# Patient Record
Sex: Male | Born: 1990 | Race: White | Hispanic: No | Marital: Single | Smoking: Never smoker
Health system: Southern US, Community
[De-identification: ages and names within clinical notes are randomized; demographics above are authoritative.]

## PROBLEM LIST (undated history)

## (undated) DIAGNOSIS — Z9889 Other specified postprocedural states: Secondary | ICD-10-CM

## (undated) DIAGNOSIS — S80211A Abrasion, right knee, initial encounter: Secondary | ICD-10-CM

## (undated) DIAGNOSIS — Z8489 Family history of other specified conditions: Secondary | ICD-10-CM

## (undated) DIAGNOSIS — R112 Nausea with vomiting, unspecified: Secondary | ICD-10-CM

## (undated) DIAGNOSIS — S52121A Displaced fracture of head of right radius, initial encounter for closed fracture: Secondary | ICD-10-CM

## (undated) HISTORY — PX: WISDOM TOOTH EXTRACTION: SHX21

---

## 2012-12-04 ENCOUNTER — Emergency Department (HOSPITAL_COMMUNITY)
Admission: EM | Admit: 2012-12-04 | Discharge: 2012-12-04 | Disposition: A | Discharge status: Home / Self Care | Payer: Managed Care, Other (non HMO) | Attending: Emergency Medicine | Admitting: Emergency Medicine

## 2012-12-04 ENCOUNTER — Encounter (HOSPITAL_COMMUNITY): Payer: Self-pay | Admitting: *Deleted

## 2012-12-04 DIAGNOSIS — H9209 Otalgia, unspecified ear: Secondary | ICD-10-CM | POA: Insufficient documentation

## 2012-12-04 DIAGNOSIS — J029 Acute pharyngitis, unspecified: Secondary | ICD-10-CM | POA: Insufficient documentation

## 2012-12-04 LAB — RAPID STREP SCREEN (MED CTR MEBANE ONLY): Streptococcus, Group A Screen (Direct): NEGATIVE

## 2012-12-04 NOTE — ED Notes (Signed)
Throat pain x5 days 7/10. Reports right ear pain as well. No other complaints. Denies cough

## 2012-12-04 NOTE — ED Notes (Signed)
Pt alert and oriented x4. Respirations even and unlabored, bilateral symmetrical rise and fall of chest. Skin warm and dry. In no acute distress. Denies needs.   

## 2012-12-04 NOTE — ED Provider Notes (Signed)
History     CSN: [id number]  Arrival date & time 12/04/12  3086   First MD Initiated Contact with Patient 12/04/12 1002      Chief Complaint  Patient presents with  . Sore Throat  . possible strep throat   . Otalgia    (Consider location/radiation/quality/duration/timing/severity/associated sxs/prior treatment) Patient is a 22 y.o. male presenting with pharyngitis and ear pain. The history is provided by the patient.  Sore Throat This is a new problem. The current episode started in the past 7 days. The problem occurs constantly. The problem has been gradually worsening. Pertinent negatives include no abdominal pain, coughing, fever or headaches. Nothing aggravates the symptoms. He has tried nothing for the symptoms.  Otalgia Location:  Right Behind ear:  No abnormality Quality:  Aching Severity:  Mild Onset quality:  Gradual Timing:  Constant Progression:  Unchanged Chronicity:  New Relieved by:  None tried Worsened by:  Nothing tried Ineffective treatments:  None tried Associated symptoms: no abdominal pain, no cough, no fever and no headaches     History reviewed. No pertinent past medical history.  No past surgical history on file.  No family history on file.  History  Substance Use Topics  . Smoking status: Never Smoker   . Smokeless tobacco: Not on file  . Alcohol Use: Yes     Comment: 6 drinks/weekly    Review of Systems  Constitutional: Negative for fever.  HENT: Positive for ear pain.   Respiratory: Negative for cough.   Gastrointestinal: Negative for abdominal pain.  Neurological: Negative for headaches.  All other systems reviewed and are negative.    Allergies  Review of patient's allergies indicates no known allergies.  Home Medications  No current outpatient prescriptions on file.  BP 134/74  Pulse 97  Temp(Src) 97.7 F (36.5 C) (Oral)  Resp 16  SpO2 100%  Physical Exam  Constitutional: He is oriented to person, place, and time.  He appears well-developed and well-nourished. No distress.  HENT:  Head: Normocephalic and atraumatic.  Right Ear: Hearing normal.  Left Ear: Hearing, tympanic membrane and external ear normal.  No petechiae, or exudates of posterior pharynx. Slight injection of right TM  Eyes: Conjunctivae are normal. Pupils are equal, round, and reactive to light.  Neck: Normal range of motion. Neck supple.  Cardiovascular: Normal rate, regular rhythm and normal heart sounds.   Pulmonary/Chest: Effort normal and breath sounds normal.  Abdominal: Soft. There is no tenderness.  Musculoskeletal: Normal range of motion. He exhibits no edema and no tenderness.  Lymphadenopathy:    He has cervical adenopathy.  Neurological: He is alert and oriented to person, place, and time.    ED Course  Procedures (including critical care time)  Labs Reviewed  RAPID STREP SCREEN   No results found.  1. Viral pharyngitis     MDM  22 yo M with no PMH presenting with 5 day history of progressively worsening sore throat without any associated symptoms other than mild right ear pain.  No known sick contacts. Rapid strep screen still pending. Given history and physical exam, patient most likely has a viral URI. Expect that on day 5 of illness he will begin to improve soon. For now, continue symptomatic treatment. Patient discharged home in stable condition. Will call if strep test is positive. Discussed with Dr. Juleen China.   Hilarie Fredrickson, MD 12/04/12 1046

## 2012-12-04 NOTE — ED Notes (Signed)
MD at bedside. 

## 2012-12-04 NOTE — ED Notes (Addendum)
Pt escorted to discharge window. Pt verbalized understanding discharge instructions. In no acute distress.  

## 2012-12-06 NOTE — ED Provider Notes (Signed)
I saw and evaluated the patient, reviewed the resident's note and I agree with the findings and plan.  21yM with sore throat. Clinically suspect viral pharyngitis. No distress on exam. No evidence of deep space neck infection. Plan symptomatic tx.   Raeford Razor, MD 12/06/12 [phone]

## 2014-02-20 ENCOUNTER — Emergency Department (HOSPITAL_COMMUNITY)
Admission: EM | Admit: 2014-02-20 | Discharge: 2014-02-20 | Disposition: A | Discharge status: Home / Self Care | Payer: Managed Care, Other (non HMO) | Attending: Emergency Medicine | Admitting: Emergency Medicine

## 2014-02-20 ENCOUNTER — Emergency Department (HOSPITAL_COMMUNITY): Payer: Managed Care, Other (non HMO)

## 2014-02-20 ENCOUNTER — Encounter (HOSPITAL_COMMUNITY): Payer: Self-pay | Admitting: Emergency Medicine

## 2014-02-20 DIAGNOSIS — S52123A Displaced fracture of head of unspecified radius, initial encounter for closed fracture: Secondary | ICD-10-CM | POA: Insufficient documentation

## 2014-02-20 DIAGNOSIS — S52121A Displaced fracture of head of right radius, initial encounter for closed fracture: Secondary | ICD-10-CM

## 2014-02-20 DIAGNOSIS — S80211A Abrasion, right knee, initial encounter: Secondary | ICD-10-CM

## 2014-02-20 DIAGNOSIS — Y9351 Activity, roller skating (inline) and skateboarding: Secondary | ICD-10-CM | POA: Insufficient documentation

## 2014-02-20 DIAGNOSIS — S62009A Unspecified fracture of navicular [scaphoid] bone of unspecified wrist, initial encounter for closed fracture: Secondary | ICD-10-CM

## 2014-02-20 DIAGNOSIS — Y929 Unspecified place or not applicable: Secondary | ICD-10-CM | POA: Insufficient documentation

## 2014-02-20 HISTORY — DX: Displaced fracture of head of right radius, initial encounter for closed fracture: S52.121A

## 2014-02-20 HISTORY — DX: Abrasion, right knee, initial encounter: S80.211A

## 2014-02-20 MED ORDER — HYDROCODONE-ACETAMINOPHEN 5-325 MG PO TABS: 1.0000 | ORAL_TABLET | Freq: Four times a day (QID) | ORAL | Status: DC | PRN

## 2014-02-20 NOTE — ED Notes (Signed)
Pt was skateboarding, fell onto rt elbow.  C/o pain.  Happened 1 hr ago.  No deformity noted.

## 2014-02-20 NOTE — ED Provider Notes (Signed)
CSN: [id number]     Arrival date & time 02/20/14  1312 History  This chart was scribed for non-physician practitioner, Steven MuttonMarissa Quaneisha Hanisch, PA-C working with Steven Batonourtney F Horton, MD by Steven Snyder, ED scribe. This patient was seen in room WTR6/WTR6 and the patient's care was started at 2:47 PM.   Chief Complaint  Patient presents with  . Fall  . Arm Pain   The history is provided by the patient. No language interpreter was used.   HPI Comments: Steven Snyder is a 23 y.o. male who presents to the Emergency Department complaining of a fall that occurred about 2 hours ago. Pt was skateboarding with stuff in his hands, ran into his friends and fell onto his right arm. Denies hitting his head or LOC. He has sudden onset, aching right elbow and wrist pain. Movement worsens the pain. Denies shoulder pain, numbness or tingling. Pt has a past hairline fracture but is unsure if it was his right ulna or radius. Pt is ambidextrous but favors his right hand.   History reviewed. No pertinent past medical history. No past surgical history on file. No family history on file. History  Substance Use Topics  . Smoking status: Never Smoker   . Smokeless tobacco: Not on file  . Alcohol Use: Yes     Comment: 6 drinks/weekly    Review of Systems  Musculoskeletal: Positive for arthralgias.  Neurological: Negative for numbness.  All other systems reviewed and are negative.  Allergies  Review of patient's allergies indicates no known allergies.  Home Medications   Prior to Admission medications   Not on File   BP 116/76  Pulse 96  Temp(Src) 98.4 F (36.9 C) (Oral)  Resp 18  SpO2 100%  Physical Exam  Nursing note and vitals reviewed. Constitutional: He is oriented to person, place, and time. He appears well-developed and well-nourished. No distress.  HENT:  Head: Normocephalic and atraumatic.  Negative facial trauma Negative palpation hematomas  Eyes: Conjunctivae and EOM are normal. Pupils are  equal, round, and reactive to light. Right eye exhibits no discharge. Left eye exhibits no discharge.  Neck: Normal range of motion. Neck supple. No tracheal deviation present.  Negative neck stiffness Negative nuchal rigidity Negative cervical lymphadenopathy Negative pain upon palpation to the C-spine  Cardiovascular: Normal rate, regular rhythm and normal heart sounds.   Pulses:      Radial pulses are 2+ on the right side, and 2+ on the left side.  Capillary refill less than 3 seconds  Pulmonary/Chest: Effort normal and breath sounds normal. No respiratory distress. He has no wheezes. He has no rhonchi. He has no rales.  Patient is able to speak in full sentences without difficulty Negative use of accessory muscles Negative stridor  Musculoskeletal: He exhibits tenderness.       Right elbow: He exhibits decreased range of motion. He exhibits no swelling, no effusion and no deformity. Tenderness found. Radial head, medial epicondyle, lateral epicondyle and olecranon process tenderness noted.       Arms: Mild swelling identified to the right elbow, olecranon process. Abrasion identified to the medial epicondyles. Discomfort upon palpation to the medial condyle, lateral epicondyles and olecranon process of the right elbow. Discomfort upon palpation to the radial head region of the right arm. Discomfort with positive snuffbox tenderness to the right wrist. Discomfort upon the ulnar aspect of the right wrist. Decreased range of motion to the right elbow and right wrist secondary to pain. Decreased flexion extension to the right  elbow. Poor supination and pronation secondary to pain - patient unable to perform. Full range of motion to the digits of the right hand. Negative open wounds or signs of open fractures. Negative puncture wounds. Negative deformities.  Lymphadenopathy:    He has no cervical adenopathy.  Neurological: He is alert and oriented to person, place, and time. No cranial nerve  deficit. He exhibits normal muscle tone. Coordination normal.  Cranial nerves III-XII grossly intact Decreased grip strength to the right hand secondary to pain when compared to the left Strength intact to MCP, PIP, DIP joints of right hand Sensation intact with differentiation sharp and dull touch the right upper extremity Gait proper, proper balance - negative sway, negative drift, negative step-offs  Skin: Skin is warm and dry.  Superficial abrasion to the lateral epicondyles of the right elbow Superficial abrasion identified to the lateral aspect of the right knee bleeding controlled  Psychiatric: He has a normal mood and affect. His behavior is normal.    ED Course  Procedures (including critical care time)  DIAGNOSTIC STUDIES: Oxygen Saturation is 100% on RA, normal by my interpretation.    COORDINATION OF CARE: 2:54 PM-Discussed treatment plan which includes orthopedic consult with pt at bedside and pt agreed to plan.   Labs Review Labs Reviewed - No data to display  Imaging Review Dg Elbow Complete Right  02/20/2014   CLINICAL DATA:  Post fall, now with right posterior elbow pain. Unable to fully flex or extend the right elbow  EXAM: RIGHT ELBOW - COMPLETE 3+ VIEW  COMPARISON:  None.  FINDINGS: The lateral radiograph is degraded due to obliquity.  There is a minimally displaced fracture of the radial head with extension to the radial capitellar articulation. No definite joint effusion, though note, evaluation degraded due to obliquity. No additional fractures identified. Regional soft tissues appear normal. No radiopaque foreign body.  IMPRESSION: Minimally displaced fracture of the radial head.   Electronically Signed   By: Simonne ComeJohn  Snyder M.D.   On: 02/20/2014 14:08   Dg Forearm Right  02/20/2014   CLINICAL DATA:  Post fall, now with posterior elbow pain  EXAM: RIGHT FOREARM - 2 VIEW  COMPARISON:  DG ELBOW COMPLETE*R* dated 02/20/2014  FINDINGS: Re- demonstrated minimally displaced  radial head fracture with expected elbow joint effusion. No additional fractures identified. Regional soft tissues appear normal. No radiopaque foreign body.  IMPRESSION: 1. Minimally displaced radial head fracture with associated elbow joint effusion. Please refer to dedicated right elbow radiographs performed earlier same day. 2. No additional fractures identified.   Electronically Signed   By: Simonne ComeJohn  Snyder M.D.   On: 02/20/2014 14:20   Dg Wrist Complete Right  02/20/2014   CLINICAL DATA:  Post fall, now with posterior elbow pain  EXAM: RIGHT WRIST - COMPLETE 3+ VIEW  COMPARISON:  DG FOREARM*R* dated 02/20/2014  FINDINGS: The lateral radiograph is degraded due to obliquity.  There is a vertically aligned minimally displaced fracture involving the distal aspect of the scaphoid. The fracture extends to the scaphoid's articulation with the trapezium. No dislocation. No additional fractures identified. No evidence of chondrocalcinosis. No radiopaque foreign body.  IMPRESSION: Vertically aligned minimally displaced fracture involving the distal pole of the scaphoid with extension to the scaphoid's articulation with the trapezium. No radiopaque foreign body.   Electronically Signed   By: Simonne ComeJohn  Snyder M.D.   On: 02/20/2014 14:19   Ct Elbow Right W/o Cm  02/20/2014   CLINICAL DATA:  Right radial head fracture  following a fall.  EXAM: CT OF THE RIGHT ELBOW WITHOUT CONTRAST  TECHNIQUE: Multidetector CT imaging was performed according to the standard protocol. Multiplanar CT image reconstructions were also generated.  COMPARISON:  Radiographs obtained earlier today.  FINDINGS: Mildly comminuted fracture of the radial head with 3.5 mm of depression and impaction of the anterior fragment. There is an associated effusion. No additional fractures and no dislocation.  IMPRESSION: Mildly comminuted and depressed radial head fracture with an associated effusion.   Electronically Signed   By: Gordan Payment M.D.   On: 02/20/2014 16:10    Dg Humerus Right  02/20/2014   CLINICAL DATA:  Post fall, now with posterior elbow pain  EXAM: RIGHT HUMERUS - 2+ VIEW  COMPARISON:  DG ELBOW COMPLETE*R* dated 02/20/2014; DG FOREARM*R* dated 02/20/2014  FINDINGS: Re- demonstrated displaced radial head fracture. This finding is associated with an expected elbow joint effusion.  No additional fractures identified. Limited visualization of the adjacent shoulder is normal. Regional soft tissues appear normal. No radiopaque foreign body.  IMPRESSION: 1. Displaced fracture of the radial head with associated elbow joint effusion. Please refer to dedicated right elbow radiographs performed earlier same day. 2. No additional fractures identified.   Electronically Signed   By: Simonne Come M.D.   On: 02/20/2014 14:11     EKG Interpretation None      MDM   Final diagnoses:  Fracture of radial head, right, closed  Scaphoid fracture, wrist, closed    Filed Vitals:   02/20/14 1324  BP: 116/76  Pulse: 96  Temp: 98.4 F (36.9 C)  TempSrc: Oral  Resp: 18  SpO2: 100%   I personally performed the services described in this documentation, which was scribed in my presence. The recorded information has been reviewed and is accurate.  Plain films of right forearm, right wrist, right elbow identified minimally displaced fracture of the radial head with moderate joint effusion and vertically aligned minimally displaced fracture involving the distal pole of the scaphoid with extension to the scaphoid articulation with the trapezium. Negative findings of open fractures. This provider spoke with Dr. Merlyn Lot, hand surgeon who recommended CT of the right elbow. Recommended patient to be placed in a posterior long arm and a thumb spica splint with followup in his office on Monday. CT the right elbow without contrast identified mildly comminuted and depressed radial head fracture with an associated effusion. Patient presenting to the ED with a close radial head fracture  and scaphoid fracture - negative findings of open fracture. Patient neurovascularly intact. Sensation intact. Negative focal neurological deficits noted. Pulses palpable and strong, radial 2+. Strength intact to MCP, PIP, DIP joints of the right hand. This provider spoke in with hand surgeon who recommended patient be placed in a posterior long arm and thumb spica, with recommendation for patient to followup in his office as an outpatient. Patient stable, afebrile. Patient placed in splint, immobilizer given. Discharged patient. Discussed with patient to rest, ice, elevate. Discussed with patient importance of splint care and immobilizer. Referred patient to hand specialist discussed importance of following up. Discharge patient with small dose of pain medications-discussed course, precautions, disposal technique. Discussed with patient to closely monitor symptoms and if symptoms are to worsen or change to report back to the ED - strict return instructions given.  Patient agreed to plan of care, understood, all questions answered.   Steven Mutton, PA-C 02/20/14 2058

## 2014-02-20 NOTE — Discharge Instructions (Signed)
Please call Dr. Merrilee Seashore office on Monday first thing, he is expecting your phone call Please keep splint dry and arm immobilizer Please avoid any physical or strenuous activity Please continue monitor symptoms closely and if symptoms are to worsen or change (fever greater than 101, nausea, vomiting, chest pain, shortness of breath, difficulty breathing, swelling to the hands, changes to colors of the hands, fall, injury, numbness, tingling) is report back to the ED immediately   Elbow Fracture, Radial Head with Rehab The radial head is the end of the forearm bone on the thumb side of the arm (radius). It is part of the elbow. The radial head is vulnerable to both complete and incomplete fractures. SYMPTOMS   Severe elbow and arm pain, at the time of injury.  Tenderness, inflammation, and later bruising (contusion) of the elbow (within 48 hours).  Visible deformity, if the fracture is complete, and the bone fragments are not aligned properly (displaced).  Numbness, coldness, or paralysis in the elbow, forearm, or hand from pressure on the blood vessels or nerves (uncommon). CAUSES  An elbow fracture occurs when a force is placed on the bone that is greater than it can handle. Typical causes of injury include:  Indirect trauma, such as falling on an outstretched hand (most common cause).  Direct hit (trauma) to the elbow.  Twisting injury to the elbow. RISK INCREASES WITH:  Contact sports (football, rugby).  Sports in which falling is likely (skating, basketball).  Children under 84 years of age and adults over 79.  Bone or joint disease (osteoarthritis, bone tumor).  Poor strength and flexibility. PREVENTION   Warm up and stretch properly before activity.  Maintain physical fitness:  Strength, flexibility, and endurance.  Cardiovascular fitness.  When appropriate, wear properly fitted and padded elbow protection. PROGNOSIS  If treated properly, elbow fractures often  heal within 6 to 8 weeks for adults, and 4 to 6 weeks in children.  RELATED COMPLICATIONS   Fracture does not heal (nonunion).  Fracture heals in improper alignment (malunion).  Chronic pain, stiffness, loss of motion, or swelling of the elbow.  Excessive bleeding in the elbow or at the fracture site, causing pressure and injury to nerves and blood vessels (uncommon).  Calcification of the soft tissues around the elbow (heterotopic ossification).  Risk of bone death, due to interrupted blood supply caused by the fracture.  Unstable or arthritic joint, following repeated injury.  Stopping of normal bone growth in children.  Wasting away (atrophy), weakness, stiffness, numbness, and poor control of the hand, due to injury to blood vessels, nerves, cartilage, muscle, ligaments, and connective tissue sheets (fascia). TREATMENT  If the fracture is displaced, it must be put back in proper alignment (reduced) by an individual trained in the procedure. Often, displaced fractures cannot be realigned by hand, and surgery is needed. Once the bones are aligned (with or without surgery), ice and medicine can be used to reduce pain and inflammation. The elbow should be restrained for at least 1 to 2 weeks. After restraint, it is important to complete strengthening and stretching exercises, to regain strength and a full range of motion. Theses exercises may be completed at home or with a therapist.  MEDICATION   If pain medicine is needed, nonsteroidal anti-inflammatory medicines (aspirin and ibuprofen), or other minor pain relievers (acetaminophen), are often advised.  Do not take pain medicine for 7 days before surgery.  Prescription pain relievers may be given, if your caregiver thinks they are needed. Use only as  directed and only as much as you need. COLD THERAPY  Cold treatment (icing) should be applied for 10 to 15 minutes every 2 to 3 hours for inflammation and pain, and immediately after  activity that aggravates your symptoms. Use ice packs or an ice massage. SEEK MEDICAL CARE IF:  Pain, tenderness, or swelling gets worse, despite treatment.  You experience pain, numbness, or coldness in the hand.  Blue, gray, or dark color appears in the fingernails.  Any of the following occur after surgery: fever, increased pain, swelling, redness, drainage of fluids, or bleeding in the affected area.  New, unexplained symptoms develop. (Drugs used in treatment may produce side effects.) EXERCISES  RANGE OF MOTION (ROM) AND STRETCHING EXERCISES - Elbow Fracture (Radial Head) These exercises may help you restore your elbow mobility once your physician has discontinued your restraint period. Beginning these before your caregiver's approval may result in delayed healing. Your symptoms may go away with or without further involvement from your physician, physical therapist or athletic trainer. While completing these exercises, remember:   Restoring tissue flexibility helps normal motion to return to the joints. This allows healthier, less painful movement and activity.  An effective stretch should be held for at least 30 seconds. A stretch should never be painful. You should only feel a gentle lengthening or release in the stretch. RANGE OF MOTION  Supination, Active-Assisted  Sit with your right / left elbow bent at 90 degrees, resting your forearm on a table.  Keeping your upper body and shoulder in place, roll your forearm so your palm faces upward. When you can go no farther, use your opposite hand to help, until you feel a gentle to moderate stretch. Hold for __________ seconds.  Slowly release the stretch and return to the starting position. Repeat __________ times. Complete this exercise __________ times per day. RANGE OF MOTION  Pronation, Active-Assisted   Sit with your right / left elbow bent at 90 degrees, resting your forearm on a table.  Keeping your upper body and shoulder  in place, roll your forearm so your palm faces the tabletop. When you can go no farther, use your opposite hand to help, until you feel a gentle to moderate stretch. Hold for __________ seconds.  Slowly release the stretch and return to the starting position. Repeat __________ times. Complete this exercise __________ times per day. RANGE OF MOTION - Extension  Hold your right / left arm at your side and straighten your elbow as far as you can, using your right / left arm muscles.  Straighten the right / left elbow farther by gently pushing down on your forearm, until you feel a gentle stretch on the inside of your elbow. Hold this position for __________ seconds.  Slowly return to the starting position. Repeat __________ times. Complete this exercise __________ times per day.  RANGE OF MOTION  Flexion  Hold your right / left arm at your side and bend your elbow as far as you can, using your right / left arm muscles.  Bend the right / left elbow farther by gently pushing up on your forearm, until you feel a gentle stretch on the outside of your elbow. Hold this position for __________ seconds.  Slowly return to the starting position. Repeat __________ times. Complete this exercise __________ times per day.  RANGE OF MOTION  Supination, Active  Stand or sit with your elbows at your side. Bend your right / left elbow to 90 degrees.  Turn your palm upward  until you feel a gentle stretch on the inside of your forearm.  Hold this position for __________ seconds. Slowly release and return to the starting position. Repeat __________ times. Complete this stretch __________ times per day.  RANGE OF MOTION  Pronation, Active  Stand or sit with your elbows at your side. Bend your right / left elbow to 90 degrees.  Turn your palm downward until you feel a gentle stretch on the top of your forearm.  Hold this position for __________ seconds. Slowly release and return to the starting  position. Repeat __________ times. Complete this stretch __________ times per day.  RANGE OF MOTION  Elbow Flexion, Supine  Lie on your back. Extend your right / left arm into the air, bracing it with your opposite hand. Allow your right / left arm to relax.  Let your elbow bend, allowing your hand to fall slowly toward your chest.  You should feel a gentle stretch along the back of your upper arm and elbow. Your physician, physical therapist or athletic trainer may ask you to hold a __________ hand weight, to increase the intensity of this stretch.  Hold for __________ seconds. Slowly return your right / left arm to the upright position. Repeat __________ times. Complete this exercise __________ times per day. STRETCH  Elbow flexors   Lie on a firm bed or countertop, on your back. Be sure that you are in a comfortable position which will allow you to relax your arm muscles.  Place a folded towel under your right / left upper arm, so that your elbow and shoulder are at the same height. Extend your arm; your elbow should not rest on the bed or towel.  Allow the weight of your hand to straighten your elbow. Keep your arm and chest muscles relaxed. Your caregiver may ask you to increase the intensity of your stretch by adding a small wrist or hand weight.  Hold for __________ seconds. You should feel a stretch on the inside of your elbow. Slowly return to the starting position. Repeat __________ times. Complete this exercise __________ times per day. STRENGTHENING EXERCISES - Elbow Fracture (Radial Head) These exercises may help you restore your elbow mobility once your physician has discontinued your restraint period. Beginning these before your caregiver's approval may result in delayed healing. Your symptoms may go away with or without further involvement from your physician, physical therapist or athletic trainer. While completing these exercises, remember:   Restoring tissue flexibility  helps normal motion to return to the joints. This allows healthier, less painful movement and activity.  An effective stretch should be held for at least 30 seconds. A stretch should never be painful. You should only feel a gentle lengthening or release in the stretch.  You may experience muscle soreness or fatigue, but the pain or discomfort you are trying to eliminate should never worsen during these exercises. If this pain does get worse, stop and make sure you are following the directions exactly. If the pain is still present after adjustments, discontinue the exercise until you can discuss the trouble with your caregiver. STRENGTH - Elbow Flexors, Isometric  Stand or sit upright, on a firm surface. Place your right / left arm so that your hand is palm-up, and at the height of your waist.  Place your opposite hand on top of your forearm. Gently push down as your right / left arm resists. Push as hard as you can with both arms, without causing any pain or movement at  your right / left elbow. Hold this stationary position for __________ seconds.  Gradually release the tension in both arms. Allow your muscles to relax completely before repeating. Repeat __________ times. Complete this exercise __________ times per day. STRENGTH - Elbow Extensors, Isometric  Stand or sit upright, on a firm surface. Place your right / left arm so that your palm faces your stomach, and it is at the height of your waist.  Place your opposite hand on the underside of your forearm. Gently push up as your right / left arm resists. Push as hard as you can with both arms, without causing any pain or movement at your right / left elbow. Hold this stationary position for __________ seconds.  Gradually release the tension in both arms. Allow your muscles to relax completely before repeating. Repeat __________ times. Complete this exercise __________ times per day. STRENGTH  Elbow Flexors, Supinated  With good posture,  stand, or sit on a firm chair without armrests. Allow your right / left arm to rest at your side with your palm facing forward.  Holding a __________ weight, or gripping a rubber exercise band or tubing, bring your hand toward your shoulder.  Allow your muscles to control the resistance, as your hand returns to your side. Repeat __________ times. Complete this exercise __________ times per day.  STRENGTH - Elbow Extensors, Dynamic  With good posture, stand, or sit on a firm chair without armrests. Keeping your upper arms at your side, bring both hands up toward your right / left shoulder, while gripping a rubber exercise band or tubing. Your right / left hand should be just below the other hand.  Straighten your right / left elbow. Hold for __________ seconds.  Allow your muscles to control the rubber exercise band, as your hand returns to your shoulder. Repeat __________ times. Complete this exercise __________ times per day. STRENGTH  Forearm Supinators   Sit with your right / left forearm supported on a table, keeping your elbow below shoulder height. Rest your hand over the edge, palm down.  Gently grip a hammer or a soup ladle.  Without moving your elbow, slowly turn your palm and hand upward to a "thumbs-up" position.  Hold this position for __________ seconds. Slowly return to the starting position. Repeat __________ times. Complete this exercise __________ times per day.  STRENGTH  Forearm Pronators  Sit with your right / left forearm supported on a table, keeping your elbow below shoulder height. Rest your hand over the edge, palm up.  Gently grip a hammer or a soup ladle.  Without moving your elbow, slowly turn your palm and hand upward to a "thumbs-up" position.  Hold this position for __________ seconds. Slowly return to the starting position. Repeat __________ times. Complete this exercise __________ times per day.  Document Released: 10/08/2005 Document Revised:  12/31/2011 Document Reviewed: 01/20/2009 [facility] Patient Information 2014 [place], [place].   Emergency Department Resource Guide 1) Find a Doctor and Pay Out of Pocket Although you won't have to find out who is covered by your insurance plan, it is a good idea to ask around and get recommendations. You will then need to call the office and see if the doctor you have chosen will accept you as a new patient and what types of options they offer for patients who are self-pay. Some doctors offer discounts or will set up payment plans for their patients who do not have insurance, but you will need to ask so you aren't surprised  when you get to your appointment.  2) Contact Your Local Health Department Not all health departments have doctors that can see patients for sick visits, but many do, so it is worth a call to see if yours does. If you don't know where your local health department is, you can check in your phone book. The CDC also has a tool to help you locate your state's health department, and many state websites also have listings of all of their local health departments.  3) Find a Walk-in Clinic If your illness is not likely to be very severe or complicated, you may want to try a walk in clinic. These are popping up all over the country in pharmacies, drugstores, and shopping centers. They're usually staffed by nurse practitioners or physician assistants that have been trained to treat common illnesses and complaints. They're usually fairly quick and inexpensive. However, if you have serious medical issues or chronic medical problems, these are probably not your best option.  No Primary Care Doctor: - Call Health Connect at  [phone] - they can help you locate a primary care doctor that  accepts your insurance, provides certain services, etc. - Physician Referral Service- [phone]  Chronic Pain Problems: Organization         Address  Phone   Notes  [facility]   [phone] Patients need to be referred by their primary care doctor.   Medication Assistance: Organization         Address  Phone   Notes  [facility] Medication [facility] [address] [place], [place] [address] [phone] --Must be a resident of [facility] -- Must have NO insurance coverage whatsoever (no Medicaid/ Medicare, etc.) -- The pt. MUST have a primary care doctor that directs their care regularly and follows them in the community   [facility]  [phone]   [employer]  [phone]    Agencies that provide inexpensive medical care: Organization         Address  Phone   Notes  [facility]  [phone]   [facility]    [phone]   [facility] [address] [place], [place] [address] [phone]   [facility] [address], [place] [phone]   [facility]    [phone]   [facility]    [phone]   [facility]  [address], [place] Phone:  [phone], Fax:  [phone] Hours of Operation:  9 am - 6 pm, M-F.  Also accepts Medicaid/Medicare and self-pay.  [facility]  [address], [place] Phone: [phone], Fax: [phone]. Hours of Operation:  8:30 am - 5:30 pm, M-F.  Also accepts Medicaid and self-pay.  [facility] [place] [address] [place] Phone: [phone]   [facility] [address] Natasha Bence [place], [place] [phone], Ext. [phone] Mondays & Thursdays: 7-9 AM.  First 15 patients are seen on a first come, first serve basis.    Medicaid-accepting [facility] Providers:  Organization         Address  Phone   Notes  [facility] [address], [place] [phone] Also accepts self-pay patients.  [facility] [address] Laurell Josephs [place],  [place]  [phone]   [facility] [address] [place], [address], [place] [phone]   [facility] [address], [place] [phone]   Renaye Rakers [address], [place]   [phone] Only accepts [place] patients after they have their name applied to their card.   Self-Pay (no insurance) in [facility]:  Organization         Address  Phone   Notes  Sickle Cell Patients, [facility] [address] [place], [place] [phone]   [facility] [address] [place], [place] [phone]   [facility] [place]  [address], [place] [phone]   [facility]/Dr. Osei-Bonsu  [address] [place] or [address], [place] [phone] Phone number for both [place] and [place] locations is the same.  [facility] [address] [place] [phone]   [facility] [address] [place] or [address] [phone]   [facility] [address] [place] [phone], phone; [phone], fax Sees patients 1st and 3rd Saturday of every month.  Must not qualify for public or private insurance (i.e. Medicaid, Medicare, [place] Health Choice, Veterans' Benefits)  Household income should be no more than 200% of the poverty level The clinic cannot treat you if you are pregnant or think you are pregnant  Sexually transmitted diseases are not treated at the clinic.    Dental Care: Organization         Address  Phone  Notes  [facility] Department of [facility] [address] [place], [place] [phone] Accepts children up to age 17 who are enrolled in [place] or [place] Health Choice; pregnant women with a Medicaid card; and children who have applied for Medicaid or [place] [facility]  Choice, but were declined, whose parents can pay a reduced fee at time of service.  [facility] Department of [facility]  [address], [place] [phone] Accepts children up to age 64 who are enrolled in [place] or [place] Health Choice; pregnant women with a Medicaid card; and children who have applied for Medicaid or  Health Choice, but were declined, whose parents can pay a reduced fee at time of service.  [facility]  [address] [place], [place] [phone] Patients are seen by appointment only. Walk-ins are not accepted. [facility] will see patients 37 years of age and older. Monday - Tuesday (8am-5pm) Most Wednesdays (8:30-5pm) $30 per visit, cash only  [facility]  [address], [facility] [phone] Patients are seen by appointment only. Walk-ins are not accepted. [facility] will see patients 75 years of age and older. One Wednesday Evening (Monthly: Volunteer Based).  $30 per visit, cash only  [employer]  [phone] for adults; Children under age 14, call Graduate Pediatric Dentistry at [phone]. Children aged 8-14, please call [phone] to request a pediatric application.  Dental services are provided in all areas of dental care including fillings, crowns and bridges, complete and partial dentures, implants, gum treatment, root canals, and extractions. Preventive care is also provided. Treatment is provided to both adults and children. Patients are selected via a lottery and there is often a waiting list.   [phone]  Dental Clinic [address], [place]  [phone] [email or web address]   [facility] [address] [place], [place] [phone], Ext. [phone] Second and Fourth Thursday of each month, opens at 6:30 AM; Clinic ends at 9 AM.  Patients are seen on a first-come first-served basis, and a limited number are seen during each  clinic.   [facility]  [address] Ether Griffins [place], [place] [phone]   Eligibility Requirements You must have lived in [place], [place], or [place] counties for at least the last three months.   You cannot be eligible for state or federal sponsored [place], including [employer], [place], or [employer].   You generally cannot be eligible for healthcare insurance through your employer.    How to apply: Eligibility screenings are held every Tuesday and Wednesday afternoon from 1:00 pm until 4:00 pm. You do not need an appointment for the interview!  [facility] [address], [place], [place] [phone]   [facility] Health Department  [phone]   [facility] Health Department  [phone]   [facility] Health Department  [phone]    Behavioral Health Resources in the Community: Intensive Outpatient Programs Organization         Address  Phone  Notes  [facility] [address] [place], [place] [phone]   [facility] Outpatient [address], [place], [place] [phone]   [facility] [address] [place], [place]  [phone]   [facility] Mental Health [address] [place], [place] [phone] or [phone]   Substance Abuse Resources Organization         Address  Phone  Notes  [facility]  [phone]   [facility]  [phone]   [facility] [place]  [phone]   Floydene Flock  [phone]   Residential & Outpatient Substance Abuse Program  [phone]   Psychological Services Organization         Address  Phone  Notes  [facility]  [phone]   [facility]  [phone]   [facility] [address] [place] [phone] or [phone]    Mobile Crisis Teams Organization         Address  Phone  Notes  [facility], Mobile  Crisis Care Unit  [phone]   [facility]  [address] [place], [place] [phone]   Doristine Locks [address] [place] [phone]    Self-Help/Support Groups Organization         Address  Phone             Notes  [facility] - variety of support groups  336- [phone] Call for more information  Narcotics Anonymous (NA), [facility] [address], [employer] [place]  2 meetings at this location   Statistician         Address  Phone  Notes  [facility] [address],    [place]  [phone]   [facility]  [address], [place] [address], [place], [place] [phone]   [facility] [address] [place], [place] [phone] Admissions: 8am-3pm M-F  [facility] [address]    [place], [place] [phone]   [facility] [address] [place], [place], [place] [phone]   [facility] [address]  [place], [place] [phone]   [facility] Intensive Outpatient [address], [place], [place] [phone]   [facility] [address] [place]  [place], [place] [phone] or [phone]   [facility] [address], [place], [place] [phone] Accepts Medicaid  [facility] [place] [facility] [address]  [place] [phone] Substance Abuse/Addiction Treatment   [facility]         Address  Phone  Notes  [facility]  [phone]   Angie Fava, PhD [address]vin Knack [place], [place]   [phone] or [phone]   [facility]   [address] [place], [place] [phone]   [facility] [address], [place], [place] [phone] Insurance/Medicaid/sponsorship through [facility] [address]                                    [place], [place] [phone] Therapy/tele-psych/case  [facility] [address], [place] [phone]    Dr. Lolly Mustache  [phone]   [facility]  [employer] [facility] Dept. 1) [address], [place] 2) [address] [place] 3)  [address], [place] [phone]  [phone]   [facility] Child Abuse Hotline [phone] or [phone] (After Hours)

## 2014-02-21 NOTE — ED Provider Notes (Signed)
Medical screening examination/treatment/procedure(s) were performed by non-physician practitioner and as supervising physician I was immediately available for consultation/collaboration.   EKG Interpretation None       Shon Batonourtney F Navada Osterhout, MD 02/21/14 [phone]

## 2014-02-22 ENCOUNTER — Encounter (HOSPITAL_BASED_OUTPATIENT_CLINIC_OR_DEPARTMENT_OTHER): Payer: Self-pay | Admitting: *Deleted

## 2014-02-22 ENCOUNTER — Other Ambulatory Visit: Payer: Self-pay | Admitting: Orthopedic Surgery

## 2014-02-23 ENCOUNTER — Encounter (HOSPITAL_BASED_OUTPATIENT_CLINIC_OR_DEPARTMENT_OTHER): Payer: Managed Care, Other (non HMO) | Admitting: Certified Registered"

## 2014-02-23 ENCOUNTER — Encounter (HOSPITAL_BASED_OUTPATIENT_CLINIC_OR_DEPARTMENT_OTHER): Payer: Self-pay | Admitting: Certified Registered"

## 2014-02-23 ENCOUNTER — Ambulatory Visit (HOSPITAL_BASED_OUTPATIENT_CLINIC_OR_DEPARTMENT_OTHER)
Admission: RE | Admit: 2014-02-23 | Discharge: 2014-02-23 | Disposition: A | Discharge status: Home / Self Care | Payer: Managed Care, Other (non HMO) | Source: Ambulatory Visit | Attending: Orthopedic Surgery | Admitting: Orthopedic Surgery

## 2014-02-23 ENCOUNTER — Ambulatory Visit (HOSPITAL_BASED_OUTPATIENT_CLINIC_OR_DEPARTMENT_OTHER): Payer: Managed Care, Other (non HMO) | Admitting: Certified Registered"

## 2014-02-23 ENCOUNTER — Encounter (HOSPITAL_BASED_OUTPATIENT_CLINIC_OR_DEPARTMENT_OTHER)
Admission: RE | Disposition: A | Discharge status: Home / Self Care | Payer: Self-pay | Source: Ambulatory Visit | Attending: Orthopedic Surgery

## 2014-02-23 DIAGNOSIS — Y9239 Other specified sports and athletic area as the place of occurrence of the external cause: Secondary | ICD-10-CM | POA: Insufficient documentation

## 2014-02-23 DIAGNOSIS — Y92838 Other recreation area as the place of occurrence of the external cause: Secondary | ICD-10-CM

## 2014-02-23 DIAGNOSIS — S52123A Displaced fracture of head of unspecified radius, initial encounter for closed fracture: Secondary | ICD-10-CM | POA: Insufficient documentation

## 2014-02-23 HISTORY — DX: Displaced fracture of head of right radius, initial encounter for closed fracture: S52.121A

## 2014-02-23 HISTORY — PX: ORIF RADIAL FRACTURE: SHX5113

## 2014-02-23 HISTORY — DX: Family history of other specified conditions: Z84.89

## 2014-02-23 HISTORY — DX: Abrasion, right knee, initial encounter: S80.211A

## 2014-02-23 HISTORY — DX: Other specified postprocedural states: Z98.890

## 2014-02-23 HISTORY — DX: Other specified postprocedural states: R11.2

## 2014-02-23 SURGERY — OPEN REDUCTION INTERNAL FIXATION (ORIF) RADIAL FRACTURE
Anesthesia: Monitor Anesthesia Care | Site: Elbow | Laterality: Right

## 2014-02-23 MED ORDER — PROPOFOL 10 MG/ML IV BOLUS
INTRAVENOUS | Status: DC | PRN
  Administered 2014-02-23: 300 mg via INTRAVENOUS

## 2014-02-23 MED ORDER — MIDAZOLAM HCL 2 MG/2ML IJ SOLN
INTRAMUSCULAR | Status: AC
  Filled 2014-02-23: qty 2

## 2014-02-23 MED ORDER — ROPIVACAINE HCL 5 MG/ML IJ SOLN
INTRAMUSCULAR | Status: DC | PRN
  Administered 2014-02-23: 25 mL via PERINEURAL

## 2014-02-23 MED ORDER — ACETAMINOPHEN 325 MG PO TABS: 325.0000 mg | ORAL_TABLET | ORAL | Status: DC | PRN

## 2014-02-23 MED ORDER — OXYCODONE-ACETAMINOPHEN 5-325 MG PO TABS: ORAL_TABLET | ORAL | Status: AC

## 2014-02-23 MED ORDER — DEXAMETHASONE SODIUM PHOSPHATE 10 MG/ML IJ SOLN
INTRAMUSCULAR | Status: DC | PRN
  Administered 2014-02-23: 10 mg via INTRAVENOUS

## 2014-02-23 MED ORDER — FENTANYL CITRATE 0.05 MG/ML IJ SOLN
INTRAMUSCULAR | Status: AC
  Filled 2014-02-23: qty 6

## 2014-02-23 MED ORDER — KETOROLAC TROMETHAMINE 30 MG/ML IJ SOLN: 15.0000 mg | Freq: Once | INTRAMUSCULAR | Status: DC | PRN

## 2014-02-23 MED ORDER — MIDAZOLAM HCL 2 MG/ML PO SYRP: 12.0000 mg | ORAL_SOLUTION | Freq: Once | ORAL | Status: DC | PRN

## 2014-02-23 MED ORDER — CHLORHEXIDINE GLUCONATE 4 % EX LIQD: 60.0000 mL | Freq: Once | CUTANEOUS | Status: DC

## 2014-02-23 MED ORDER — OXYCODONE HCL 5 MG PO TABS: 5.0000 mg | ORAL_TABLET | Freq: Once | ORAL | Status: DC | PRN

## 2014-02-23 MED ORDER — FENTANYL CITRATE 0.05 MG/ML IJ SOLN
INTRAMUSCULAR | Status: DC | PRN
  Administered 2014-02-23 (×2): 25 ug via INTRAVENOUS

## 2014-02-23 MED ORDER — OXYCODONE HCL 5 MG/5ML PO SOLN: 5.0000 mg | Freq: Once | ORAL | Status: DC | PRN

## 2014-02-23 MED ORDER — HYDROMORPHONE HCL PF 1 MG/ML IJ SOLN: 0.2500 mg | INTRAMUSCULAR | Status: DC | PRN

## 2014-02-23 MED ORDER — ACETAMINOPHEN 160 MG/5ML PO SOLN: 325.0000 mg | ORAL | Status: DC | PRN

## 2014-02-23 MED ORDER — LIDOCAINE HCL (CARDIAC) 20 MG/ML IV SOLN
INTRAVENOUS | Status: DC | PRN
  Administered 2014-02-23: 50 mg via INTRAVENOUS

## 2014-02-23 MED ORDER — CEFAZOLIN SODIUM-DEXTROSE 2-3 GM-% IV SOLR
INTRAVENOUS | Status: AC
  Filled 2014-02-23: qty 50

## 2014-02-23 MED ORDER — LACTATED RINGERS IV SOLN
INTRAVENOUS | Status: DC
Start: 2014-02-23 — End: 2014-02-23
  Administered 2014-02-23: 14:00:00 via INTRAVENOUS

## 2014-02-23 MED ORDER — FENTANYL CITRATE 0.05 MG/ML IJ SOLN
INTRAMUSCULAR | Status: AC
  Filled 2014-02-23: qty 2

## 2014-02-23 MED ORDER — MIDAZOLAM HCL 2 MG/2ML IJ SOLN
1.0000 mg | INTRAMUSCULAR | Status: DC | PRN
  Administered 2014-02-23: 2 mg via INTRAVENOUS

## 2014-02-23 MED ORDER — FENTANYL CITRATE 0.05 MG/ML IJ SOLN
50.0000 ug | INTRAMUSCULAR | Status: DC | PRN
  Administered 2014-02-23: 100 ug via INTRAVENOUS

## 2014-02-23 MED ORDER — CEFAZOLIN SODIUM-DEXTROSE 2-3 GM-% IV SOLR
2.0000 g | INTRAVENOUS | Status: AC
  Administered 2014-02-23: 2 g via INTRAVENOUS

## 2014-02-23 MED ORDER — ONDANSETRON HCL 4 MG/2ML IJ SOLN
INTRAMUSCULAR | Status: DC | PRN
  Administered 2014-02-23: 4 mg via INTRAVENOUS

## 2014-02-23 MED ORDER — ONDANSETRON HCL 4 MG/2ML IJ SOLN: 4.0000 mg | Freq: Once | INTRAMUSCULAR | Status: DC | PRN

## 2014-02-23 SURGICAL SUPPLY — 61 items
BANDAGE ELASTIC 3 VELCRO ST LF (GAUZE/BANDAGES/DRESSINGS) ×6 IMPLANT
BIT DRILL MINI LNG ACUTRAK 2 (BIT) ×1 IMPLANT
BLADE 15 SAFETY STRL DISP (BLADE) IMPLANT
BLADE MINI RND TIP GREEN BEAV (BLADE) IMPLANT
BLADE SURG 15 STRL LF DISP TIS (BLADE) ×2 IMPLANT
BLADE SURG 15 STRL SS (BLADE) ×4
BNDG ELASTIC 2 VLCR STRL LF (GAUZE/BANDAGES/DRESSINGS) IMPLANT
BNDG ESMARK 4X9 LF (GAUZE/BANDAGES/DRESSINGS) ×3 IMPLANT
BNDG GAUZE ELAST 4 BULKY (GAUZE/BANDAGES/DRESSINGS) ×3 IMPLANT
BNDG PLASTER X FAST 3X3 WHT LF (CAST SUPPLIES) ×9 IMPLANT
CHLORAPREP W/TINT 26ML (MISCELLANEOUS) ×3 IMPLANT
CORDS BIPOLAR (ELECTRODE) ×3 IMPLANT
COVER MAYO STAND STRL (DRAPES) ×3 IMPLANT
COVER TABLE BACK 60X90 (DRAPES) ×3 IMPLANT
CUFF TOURNIQUET SINGLE 18IN (TOURNIQUET CUFF) ×3 IMPLANT
DRAPE EXTREMITY T 121X128X90 (DRAPE) ×3 IMPLANT
DRAPE OEC MINIVIEW 54X84 (DRAPES) ×3 IMPLANT
DRAPE SURG 17X23 STRL (DRAPES) ×3 IMPLANT
DRILL MINI LNG ACUTRAK 2 (BIT) ×3
GAUZE SPONGE 4X4 12PLY STRL (GAUZE/BANDAGES/DRESSINGS) ×3 IMPLANT
GAUZE XEROFORM 1X8 LF (GAUZE/BANDAGES/DRESSINGS) ×3 IMPLANT
GLOVE BIO SURGEON STRL SZ7.5 (GLOVE) ×3 IMPLANT
GLOVE BIOGEL PI IND STRL 8 (GLOVE) ×1 IMPLANT
GLOVE BIOGEL PI IND STRL 8.5 (GLOVE) ×1 IMPLANT
GLOVE BIOGEL PI INDICATOR 8 (GLOVE) ×2
GLOVE BIOGEL PI INDICATOR 8.5 (GLOVE) ×2
GLOVE SURG ORTHO 8.0 STRL STRW (GLOVE) ×3 IMPLANT
GOWN STRL REUS W/ TWL LRG LVL3 (GOWN DISPOSABLE) ×1 IMPLANT
GOWN STRL REUS W/TWL LRG LVL3 (GOWN DISPOSABLE) ×2
GOWN STRL REUS W/TWL XL LVL3 (GOWN DISPOSABLE) ×6 IMPLANT
GUIDEWIRE ORTHO MINI ACTK .045 (WIRE) ×9 IMPLANT
NEEDLE HYPO 22GX1.5 SAFETY (NEEDLE) IMPLANT
NEEDLE HYPO 25X1 1.5 SAFETY (NEEDLE) IMPLANT
NS IRRIG 1000ML POUR BTL (IV SOLUTION) ×3 IMPLANT
PACK BASIN DAY SURGERY FS (CUSTOM PROCEDURE TRAY) ×3 IMPLANT
PAD CAST 3X4 CTTN HI CHSV (CAST SUPPLIES) ×2 IMPLANT
PAD CAST 4YDX4 CTTN HI CHSV (CAST SUPPLIES) IMPLANT
PADDING CAST ABS 4INX4YD NS (CAST SUPPLIES) ×2
PADDING CAST ABS COTTON 4X4 ST (CAST SUPPLIES) ×1 IMPLANT
PADDING CAST COTTON 3X4 STRL (CAST SUPPLIES) ×4
PADDING CAST COTTON 4X4 STRL (CAST SUPPLIES)
SCREW ACUTRAK 2 MINI 16MM (Screw) ×3 IMPLANT
SCREW ACUTRAK 2 MINI 18MM (Screw) ×3 IMPLANT
SLEEVE SCD COMPRESS KNEE MED (MISCELLANEOUS) ×3 IMPLANT
SPLINT PLASTER CAST XFAST 4X15 (CAST SUPPLIES) IMPLANT
SPLINT PLASTER XTRA FAST SET 4 (CAST SUPPLIES)
STOCKINETTE 4X48 STRL (DRAPES) ×3 IMPLANT
SUCTION FRAZIER TIP 10 FR DISP (SUCTIONS) IMPLANT
SUT ETHILON 3 0 PS 1 (SUTURE) IMPLANT
SUT ETHILON 4 0 PS 2 18 (SUTURE) ×3 IMPLANT
SUT VIC AB 2-0 SH 27 (SUTURE) ×2
SUT VIC AB 2-0 SH 27XBRD (SUTURE) ×1 IMPLANT
SUT VIC AB 3-0 PS1 18 (SUTURE)
SUT VIC AB 3-0 PS1 18XBRD (SUTURE) IMPLANT
SUT VICRYL 4-0 PS2 18IN ABS (SUTURE) ×3 IMPLANT
SYR BULB 3OZ (MISCELLANEOUS) ×3 IMPLANT
SYR CONTROL 10ML LL (SYRINGE) IMPLANT
TOWEL OR 17X24 6PK STRL BLUE (TOWEL DISPOSABLE) ×6 IMPLANT
TUBE CONNECTING 20'X1/4 (TUBING)
TUBE CONNECTING 20X1/4 (TUBING) IMPLANT
UNDERPAD 30X30 INCONTINENT (UNDERPADS AND DIAPERS) ×3 IMPLANT

## 2014-02-23 NOTE — Anesthesia Procedure Notes (Addendum)
Anesthesia Regional Block:  Supraclavicular block  Pre-Anesthetic Checklist: ,, timeout performed, Correct Patient, Correct Site, Correct Laterality, Correct Procedure, Correct Position, site marked, Risks and benefits discussed,  Surgical consent,  Pre-op evaluation,  At surgeon's request and post-op pain management  Laterality: Upper and Right  Prep: chloraprep       Needles:  Injection technique: Single-shot  Needle Type: Echogenic Needle          Additional Needles:  Procedures: ultrasound guided (picture in chart) Supraclavicular block Narrative:  Start time: 02/23/2014 1:52 PM End time: 02/23/2014 1:58 PM Injection made incrementally with aspirations every 5 mL.  Performed by: Personally  Anesthesiologist: Moser  Additional Notes: H+P and labs reviewed, risks and benefits discussed with patient, procedure tolerated well without complications   Procedure Name: LMA Insertion Performed by: Lance CoonWEBSTER, Tovey Pre-anesthesia Checklist: Patient identified, Emergency Drugs available, Suction available and Patient being monitored Patient Re-evaluated:Patient Re-evaluated prior to inductionOxygen Delivery Method: Circle System Utilized Preoxygenation: Pre-oxygenation with 100% oxygen Intubation Type: IV induction Ventilation: Mask ventilation without difficulty LMA: LMA inserted LMA Size: 4.0 Number of attempts: 1 Airway Equipment and Method: bite block Placement Confirmation: positive ETCO2 Tube secured with: Tape Dental Injury: Teeth and Oropharynx as per pre-operative assessment

## 2014-02-23 NOTE — Transfer of Care (Signed)
Immediate Anesthesia Transfer of Care Note  Patient: Steven Snyder  Procedure(s) Performed: Procedure(s): OPEN REDUCTION INTERNAL FIXATION (ORIF) RADIAL FRACTURE VS ARTHROPLASTY  (Right)  Patient Location: PACU  Anesthesia Type:GA combined with regional for post-op pain  Level of Consciousness: awake, sedated and patient cooperative  Airway & Oxygen Therapy: Patient Spontanous Breathing and Patient connected to face mask oxygen  Post-op Assessment: Report given to PACU RN and Post -op Vital signs reviewed and stable  Post vital signs: Reviewed and stable  Complications: No apparent anesthesia complications

## 2014-02-23 NOTE — Anesthesia Preprocedure Evaluation (Signed)
Anesthesia Evaluation  Patient identified by MRN, date of birth, ID band Patient awake    Reviewed: Allergy & Precautions, H&P , NPO status , Patient's Chart, lab work & pertinent test results  History of Anesthesia Complications (+) Family history of anesthesia reaction  Airway Mallampati: I TM Distance: >3 FB Neck ROM: Full    Dental  (+) Teeth Intact   Pulmonary neg pulmonary ROS,  breath sounds clear to auscultation        Cardiovascular negative cardio ROS  Rhythm:Regular     Neuro/Psych negative neurological ROS  negative psych ROS   GI/Hepatic negative GI ROS, Neg liver ROS,   Endo/Other  negative endocrine ROS  Renal/GU negative Renal ROS     Musculoskeletal   Abdominal   Peds  Hematology negative hematology ROS (+)   Anesthesia Other Findings   Reproductive/Obstetrics                           Anesthesia Physical Anesthesia Plan  ASA: I  Anesthesia Plan: Regional, MAC and General   Post-op Pain Management:    Induction: Intravenous  Airway Management Planned: LMA, Simple Face Mask and Natural Airway  Additional Equipment: None  Intra-op Plan:   Post-operative Plan: Extubation in OR  Informed Consent: I have reviewed the patients History and Physical, chart, labs and discussed the procedure including the risks, benefits and alternatives for the proposed anesthesia with the patient or authorized representative who has indicated his/her understanding and acceptance.   Dental advisory given  Plan Discussed with: CRNA and Surgeon  Anesthesia Plan Comments: (LMA vs MAC depending on block set up)        Anesthesia Quick Evaluation

## 2014-02-23 NOTE — Op Note (Signed)
Intra-operative fluoroscopic images in the AP, lateral, and oblique views were taken and evaluated by myself.  Reduction and hardware placement were confirmed.  There was no intraarticular penetration of permanent hardware.  

## 2014-02-23 NOTE — H&P (Signed)
  Steven Snyder is an 23 y.o. male.   Chief Complaint: right radial head fracture HPI: 23 yo male states he fell off skateboard 02/20/14 injuring right arm.  [facility] revealed right radial head fracture and scaphoid tubercle fracture.  Splinted and followed up in office.    Past Medical History  Diagnosis Date  . PONV (postoperative nausea and vomiting)   . Right radial head fracture 02/20/2014    fell while skateboarding  . Abrasion of knee, right 02/20/2014  . Family history of anesthesia complication     mother has myasthenia gravis - anesthesia is either not effective or overly effective    Past Surgical History  Procedure Laterality Date  . Wisdom tooth extraction      Family History  Problem Relation Age of Onset  . Myasthenia gravis Mother   . Anesthesia problems Mother     anesthesia is either less or overly effective due to myasthenia gravis   Social History:  reports that he has never smoked. He has never used smokeless tobacco. He reports that he drinks alcohol. He reports that he does not use illicit drugs.  Allergies: No Known Allergies  Medications Prior to Admission  Medication Sig Dispense Refill  . HYDROcodone-acetaminophen (NORCO/VICODIN) 5-325 MG per tablet Take 1 tablet by mouth every 6 (six) hours as needed.  15 tablet  0    No results found for this or any previous visit (from the past 48 hour(s)).  No results found.   A comprehensive review of systems was negative.  Blood pressure 127/77, pulse 88, temperature 98.3 F (36.8 C), temperature source Oral, resp. rate 15, height 5\' 7"  (1.702 m), weight 65.772 kg (145 lb), SpO2 97.00%.  General appearance: alert, cooperative and appears stated age Head: Normocephalic, without obvious abnormality, atraumatic Neck: supple, symmetrical, trachea midline Resp: clear to auscultation bilaterally Cardio: regular rate and rhythm GI: non tender Extremities: intact sensation and capillary refill all  digits.  +epl/fpl/io.  ttp scaphoid tubercle and radial head.  no wounds. Pulses: 2+ and symmetric Skin: Skin color, texture, turgor normal. No rashes or lesions Neurologic: Grossly normal Incision/Wound: none  Assessment/Plan Right radial head fracture.  Non operative and operative treatment options were discussed with the patient and patient wishes to proceed with operative treatment. Recommend OR for ORIF vs arthroplasty right radial head.  Risks, benefits, and alternatives of surgery were discussed and the patient agrees with the plan of care.   Tami RibasKevin R Myangel Summons 02/23/2014, 3:04 PM

## 2014-02-23 NOTE — Anesthesia Postprocedure Evaluation (Signed)
  Anesthesia Post-op Note  Patient: Steven SitesJames Snyder  Procedure(s) Performed: Procedure(s): OPEN REDUCTION INTERNAL FIXATION (ORIF) RADIAL FRACTURE VS ARTHROPLASTY  (Right)  Patient Location: PACU  Anesthesia Type:General and GA combined with regional for post-op pain  Level of Consciousness: awake, alert , oriented and patient cooperative  Airway and Oxygen Therapy: Patient Spontanous Breathing  Post-op Pain: none  Post-op Assessment: Post-op Vital signs reviewed, Patient's Cardiovascular Status Stable, Respiratory Function Stable, Patent Airway, No signs of Nausea or vomiting and Pain level controlled  Post-op Vital Signs: stable  Last Vitals:  Filed Vitals:   02/23/14 1715  BP: 129/78  Pulse: 121  Temp:   Resp: 14    Complications: No apparent anesthesia complications

## 2014-02-23 NOTE — Brief Op Note (Signed)
02/23/2014  4:57 PM  PATIENT:  Steven Snyder  23 y.o. male  PRE-OPERATIVE DIAGNOSIS:  RIGHT RADIAL HEAD FRACTURE  POST-OPERATIVE DIAGNOSIS:  RIGHT RADIAL HEAD FRACTURE  PROCEDURE:  Procedure(s): OPEN REDUCTION INTERNAL FIXATION (ORIF) RADIAL FRACTURE VS ARTHROPLASTY  (Right)  SURGEON:  Surgeon(s) and Role:    * Tami RibasKevin R Genavie Boettger, MD - Primary    * Nicki ReaperGary R Malary [place], MD - Assisting  PHYSICIAN ASSISTANT:   ASSISTANTS: Cindee SaltGary Eluzer Howdeshell, MD   ANESTHESIA:   regional and general  EBL:     BLOOD ADMINISTERED:none  DRAINS: none   LOCAL MEDICATIONS USED:  NONE  SPECIMEN:  No Specimen  DISPOSITION OF SPECIMEN:  N/A  COUNTS:  YES  TOURNIQUET:   Total Tourniquet Time Documented: Upper Arm (Right) - 63 minutes Total: Upper Arm (Right) - 63 minutes   DICTATION: .Other Dictation: Dictation Number [id number]  PLAN OF CARE: Discharge to home after PACU  PATIENT DISPOSITION:  PACU - hemodynamically stable.

## 2014-02-23 NOTE — Progress Notes (Signed)
Assisted Dr. Maple HudsonMoser with right ultrasound guided supraclavicular block. Side rails up, monitors on throughout procedure. See vital signs in flow sheet. Tolerated Procedure well.

## 2014-02-23 NOTE — Op Note (Signed)
[id number]

## 2014-02-23 NOTE — Discharge Instructions (Addendum)
[facility] Instructions °Hand Surgery ° °Wound Care: °Keep your hand elevated above the level of your heart.  Do not allow it to dangle by your side.  Keep the dressing dry and do not remove it unless your doctor advises you to do so.  He will usually change it at the time of your post-op visit.  Moving your fingers is advised to stimulate circulation but will depend on the site of your surgery.  If you have a splint applied, your doctor will advise you regarding movement. ° °Activity: °Do not drive or operate machinery today.  Rest today and then you may return to your normal activity and work as indicated by your physician. ° °Diet:  °Drink liquids today or eat a light diet.  You may resume a regular diet tomorrow.   ° °General expectations: °Pain for two to three days. °Fingers may become slightly swollen. ° °Call your doctor if any of the following occur: °Severe pain not relieved by pain medication. °Elevated temperature. °Dressing soaked with blood. °Inability to move fingers. °White or bluish color to fingers. ° ° °Post Anesthesia Home Care Instructions ° °Activity: °Get plenty of rest for the remainder of the day. A responsible adult should stay with you for 24 hours following the procedure.  °For the next 24 hours, DO NOT: °-Drive a car °-Operate machinery °-Drink alcoholic beverages °-Take any medication unless instructed by your physician °-Make any legal decisions or sign important papers. ° °Meals: °Start with liquid foods such as gelatin or soup. Progress to regular foods as tolerated. Avoid greasy, spicy, heavy foods. If nausea and/or vomiting occur, drink only clear liquids until the nausea and/or vomiting subsides. Call your physician if vomiting continues. ° °Special Instructions/Symptoms: °Your throat may feel dry or sore from the anesthesia or the breathing tube placed in your throat during surgery. If this causes discomfort, gargle with warm salt water. The discomfort should disappear within 24  hours. ° ° °Regional Anesthesia Blocks ° °1. Numbness or the inability to move the "blocked" extremity may last from 3-48 hours after placement. The length of time depends on the medication injected and your individual response to the medication. If the numbness is not going away after 48 hours, call your surgeon. ° °2. The extremity that is blocked will need to be protected until the numbness is gone and the  Strength has returned. Because you cannot feel it, you will need to take extra care to avoid injury. Because it may be weak, you may have difficulty moving it or using it. You may not know what position it is in without looking at it while the block is in effect. ° °3. For blocks in the legs and feet, returning to weight bearing and walking needs to be done carefully. You will need to wait until the numbness is entirely gone and the strength has returned. You should be able to move your leg and foot normally before you try and bear weight or walk. You will need someone to be with you when you first try to ensure you do not fall and possibly risk injury. ° °4. Bruising and tenderness at the needle site are common side effects and will resolve in a few days. ° °5. Persistent numbness or new problems with movement should be communicated to the surgeon or the [facility] ([phone])/ [facility] ([phone]). °

## 2014-02-24 ENCOUNTER — Encounter (HOSPITAL_BASED_OUTPATIENT_CLINIC_OR_DEPARTMENT_OTHER): Payer: Self-pay | Admitting: Orthopedic Surgery

## 2014-02-24 NOTE — Op Note (Signed)
NAME:  Steven Snyder, Steven               ACCOUNT NO.:  [id number]  MEDICAL RECORD NO.:  [id number]  LOCATION:                                 FACILITY:  PHYSICIAN:  Steven LoaKevin Delia Slatten, MD        DATE OF BIRTH:  08/30/1991  DATE OF PROCEDURE:  02/23/2014 DATE OF DISCHARGE:                              OPERATIVE REPORT   PREOPERATIVE DIAGNOSIS:  Right radial head fracture.  POSTOPERATIVE DIAGNOSIS:  Right radial head fracture.  PROCEDURE:  Open reduction and internal fixation right radial head fracture.  SURGEON:  Steven LoaKevin Luie Laneve, MD.  ASSISTANT:  Steven SaltGary Lorette Peterkin, MD.  ANESTHESIA:  General with regional.  IV FLUIDS:  Per anesthesia flow sheet.  ESTIMATED BLOOD LOSS:  Minimal.  COMPLICATIONS:  None.  SPECIMENS:  None.  TOURNIQUET TIME:  63 minutes.  DISPOSITION:  Stable to PACU.  INDICATIONS:  Steven Snyder is a 23 year old male, who fell from a skateboard 3 days ago injuring his right arm.  [facility] where radiographs were taken revealing a scaphoid tubercle fracture and a radial head fracture.  He was splinted and followup in the office.  I have recommended operative fixation versus arthroplasty of the radial head.  Risks, benefits, and alternatives surgery were discussed including risk of blood loss, infection, damage to nerves, vessels, tendons, ligaments, bone; failure of surgery; need for additional surgery, complications with wound healing, continued pain, nonunion, malunion, stiffness, and posttraumatic arthritis.  He voiced understanding of these risks and elected to proceed.  OPERATIVE COURSE:  After being identified preoperatively by myself, the patient and I agreed upon procedure and site of procedure.  Surgical site was marked.  The risks, benefits, alternatives of surgery were reviewed and he wished to proceed.  Surgical consent had been signed. He was given IV Ancef as preoperative antibiotic prophylaxis.  He was transferred to the  operating room and placed on the operating table in supine position with the right upper extremity on arm board.  General anesthesia was induced by anesthesiologist.  The right upper extremity was prepped and draped in normal sterile orthopedic fashion.  Surgical pause was performed between surgeons, anesthesia, operating staff, and all were in agreement as to the patient, procedure, and site of procedure.  Tourniquet at the proximal aspect of the extremity was inflated to 250 mmHg after exsanguination of the limb with an Esmarch bandage.  A linear incision was made at the radial side of the elbow and carried into subcutaneous tissues by spreading technique.  Bipolar electrocautery was used to obtain hemostasis.  The origin of the extensor musculature was identified.  This was entered and spread.  Care was taken to stay anterior to the lateral collateral ligament.  The radiocapitellar joint was identified and entered.  There was significant hematoma.  This was removed with the suction device.  The capsule was released.  The annular ligament was released.  The radial head was visualized.  There were 2 fracture lines creating 3 portions of the radial head.  One portion was still contiguous with the radial neck and the remaining 2 portions were fractured.  One was depressed.  The fractures were cleared  of hematoma and soft tissue interposition and mobilized.  They were reduced.  It was felt that this was repairable. The Acutrak screws were used.  The Mini Acutrak screws were used.  Guide pin was placed from the portion of the radial head contiguous with the shaft across to the fracture fragment.  The C-arm was used in AP, lateral, pronated, and supinated positions to ensure appropriate reduction and position of the pin, which was the case.  The near cortex was drilled and a screw placed holding the fracture fragment in place. Good compression was obtained.  An additional guide pin was then  placed across the 2nd fracture fragment into the neck of the radius.  The C-arm was again used in AP, lateral, and pronate and supinate positions to ensure appropriate position of the skin and reduction of the fracture. Again, the nerve cortex was drilled and the screw placed achieving good compression and stabilization of the fracture.  The articular surface was visualized and had good reduction.  There is less than a 1 mm of step-off.  The wound was copiously irrigated with sterile saline.  The annular ligament was repaired with 4-0 Vicryl suture.  The capsule was repaired with 4-0 Vicryl suture.  The incision of the extensor musculature and fascia was repaired with 2-0 Vicryl suture.  The skin was closed with 4-0 Vicryl suture in an interrupted fashion.  The subcutaneous tissues and 4-0 nylon in a horizontal mattress fashion of the skin.  The wound was dressed with sterile Xeroform, 4x4s, and wrapped with a Kerlix bandage.  A posterior splint with the side bar was placed as well as a thumb spica extension.  This was wrapped with Kerlix and Ace bandage.  Tourniquet was deflated at 63 minutes.  Fingertips were pink with brisk capillary refill after deflation of tourniquet. Operative drapes were broken down.  The patient was awoken from anesthesia safely.  He was transferred back to stretcher and taken to PACU in stable condition.  I will see him back in the office in 1 week for postoperative followup.  I will give him Percocet 5/325 one to two p.o. q.6 hours p.r.n. pain, dispensed #40.     Steven LoaKevin Jeryn Bertoni, MD     KK/MEDQ  D:  02/23/2014  T:  02/24/2014  Job:  [id number]

## 2014-10-20 IMAGING — CR DG ELBOW COMPLETE 3+V*R*
4 series · 4 of 4 positions shown · non-contrast
Comparison: None.

CLINICAL DATA: Post fall, now with right posterior elbow pain.
Unable to fully flex or extend the right elbow

EXAM:
RIGHT ELBOW - COMPLETE 3+ VIEW

[x elbow ap right]
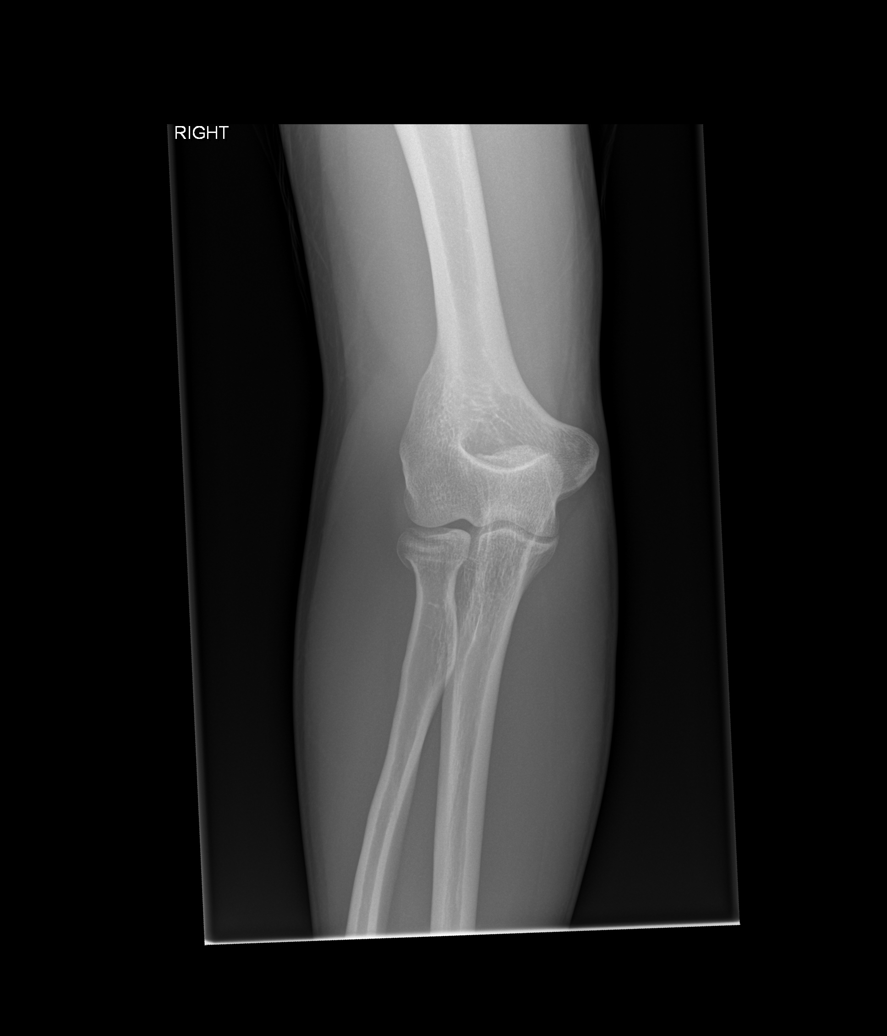

[x elbow obl right (1 of 2)]
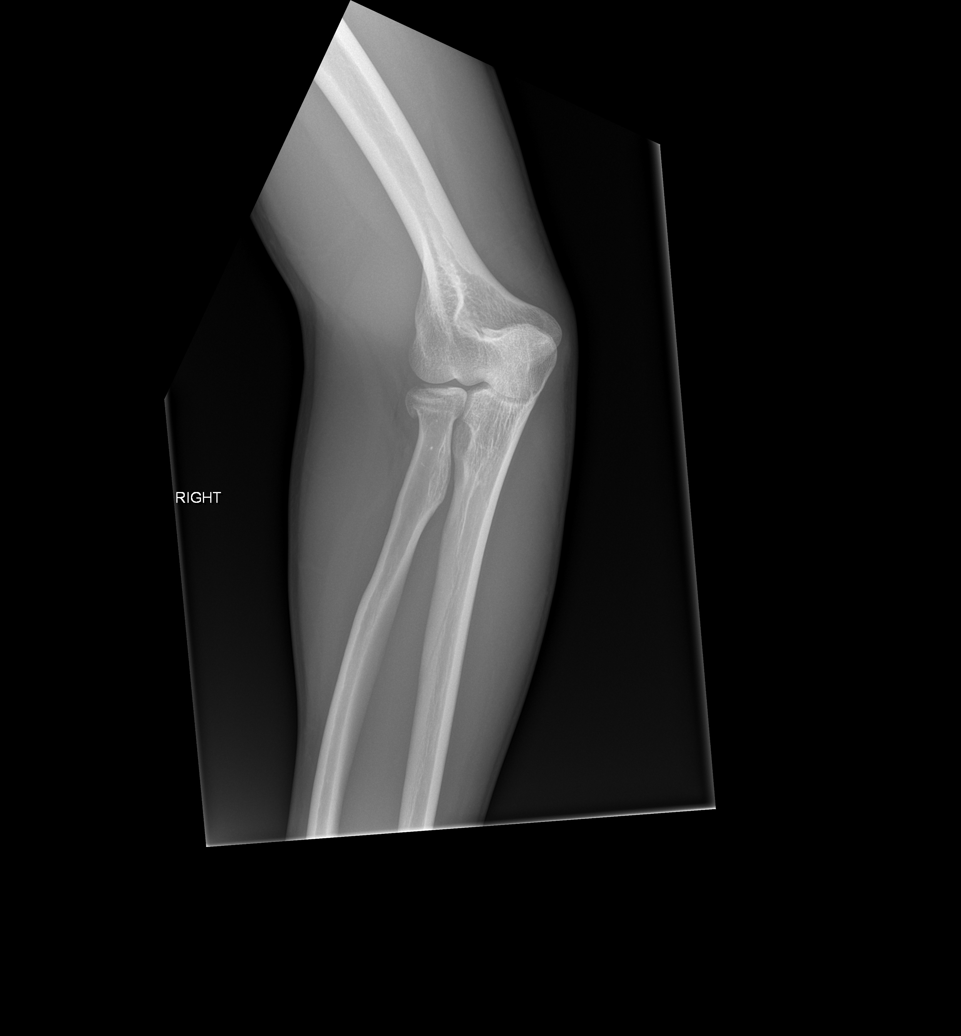

[x elbow obl right (2 of 2)]
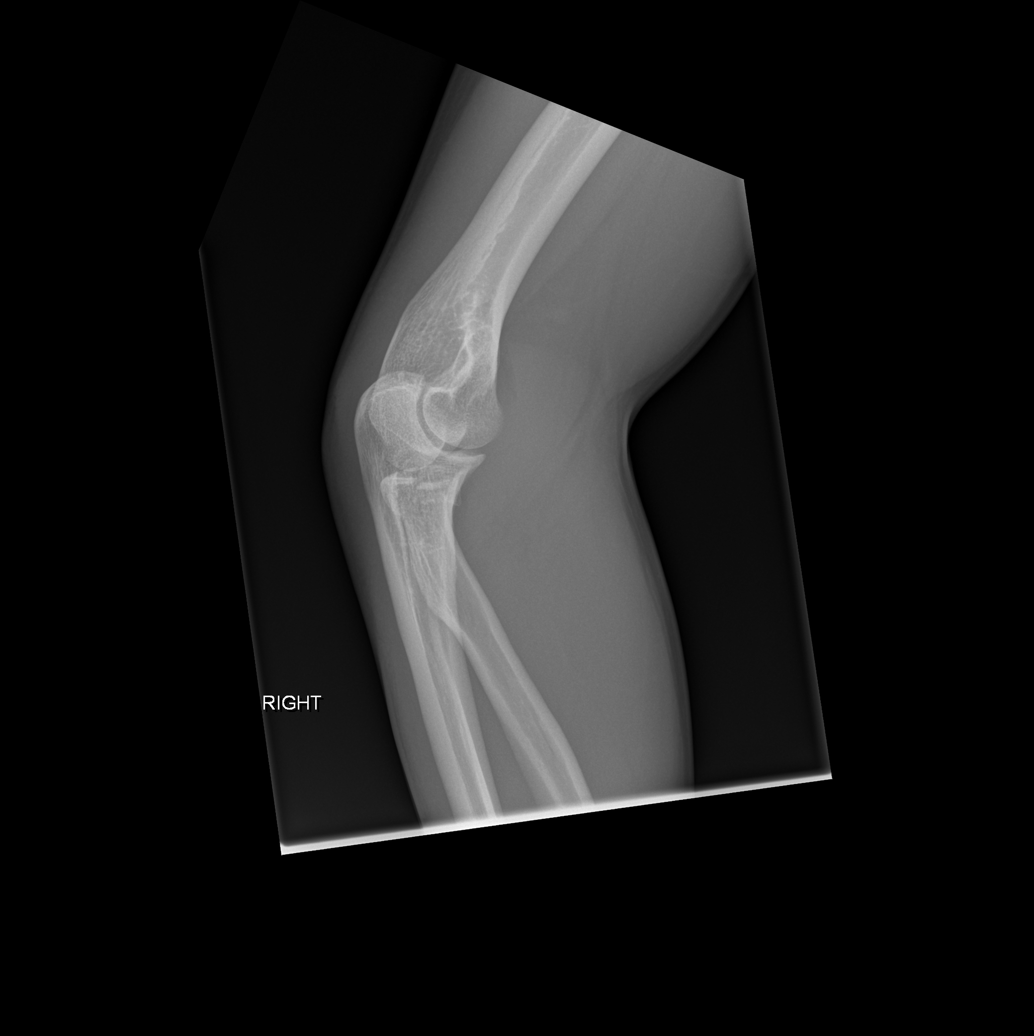

[x elbow lat right]
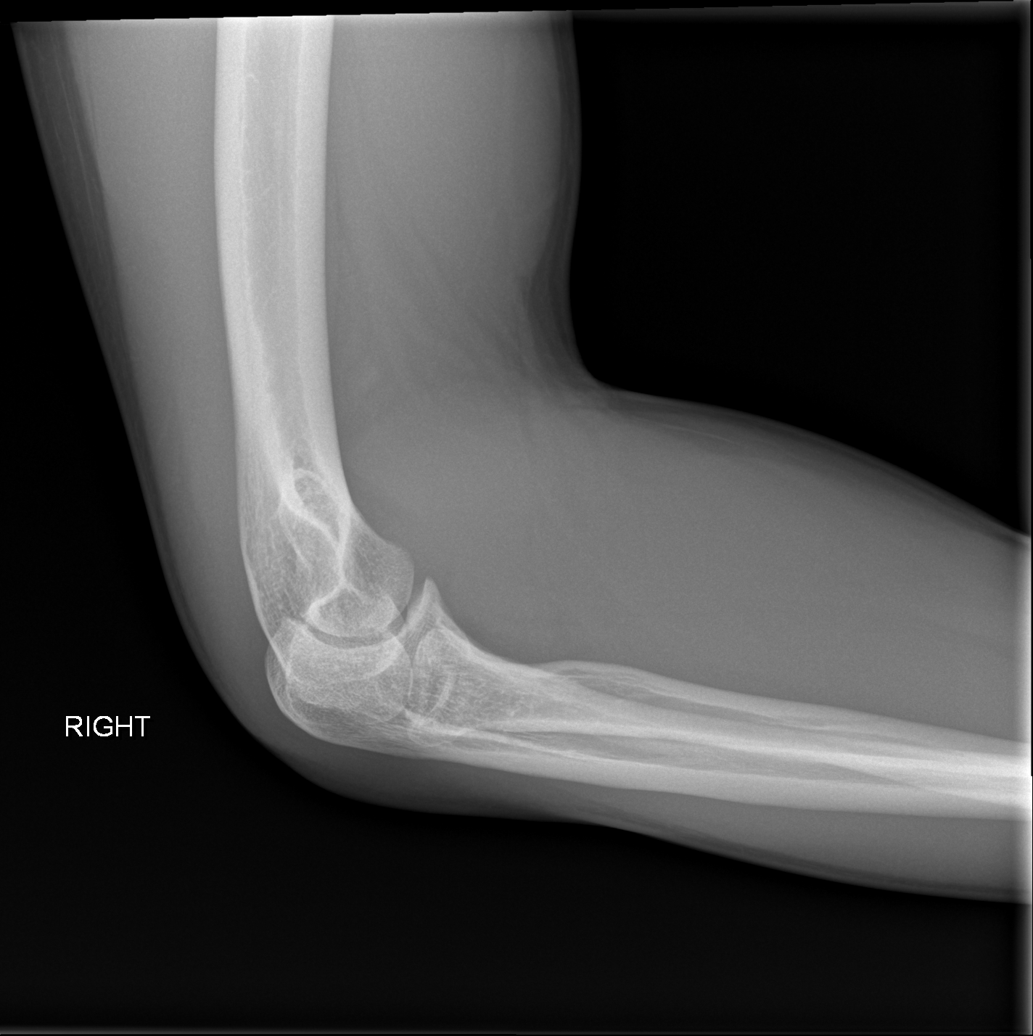

[4 of 4 positions shown; findings below may reference images not displayed]

FINDINGS: The lateral radiograph is degraded due to obliquity.

There is a minimally displaced fracture of the radial head with
extension to the radial capitellar articulation. No definite joint
effusion, though note, evaluation degraded due to obliquity. No
additional fractures identified. Regional soft tissues appear
normal. No radiopaque foreign body.
IMPRESSION: Minimally displaced fracture of the radial head.

## 2014-10-20 IMAGING — CT CT ELBOW*R* W/O CM
2 series · 16 of 27 positions shown, 20 images · non-contrast
Comparison: Radiographs obtained earlier today.

CLINICAL DATA: Right radial head fracture following a fall.

EXAM:
CT OF THE RIGHT ELBOW WITHOUT CONTRAST
TECHNIQUE: Multidetector CT imaging was performed according to the standard
protocol. Multiplanar CT image reconstructions were also generated.

[Series 12: bone windows · axial · 0.33mm/px · z∈[-255,-123]mm · 11 of 52 slices shown, 14 images]
[im 4/52  soft-tissue]
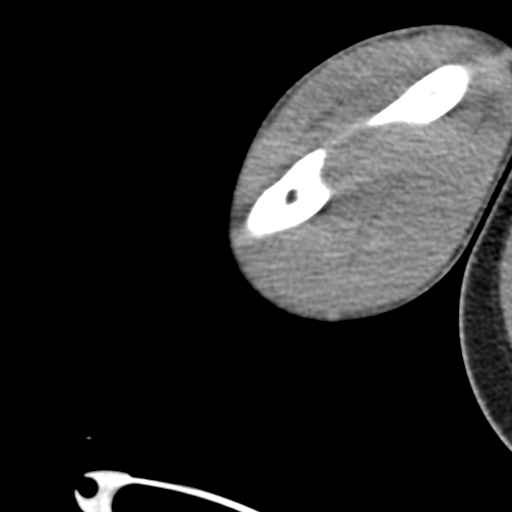
[im 4/52  bone]
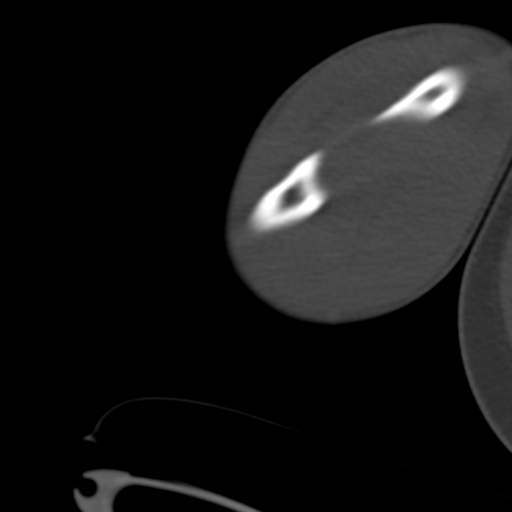
[im 8/52  bone]
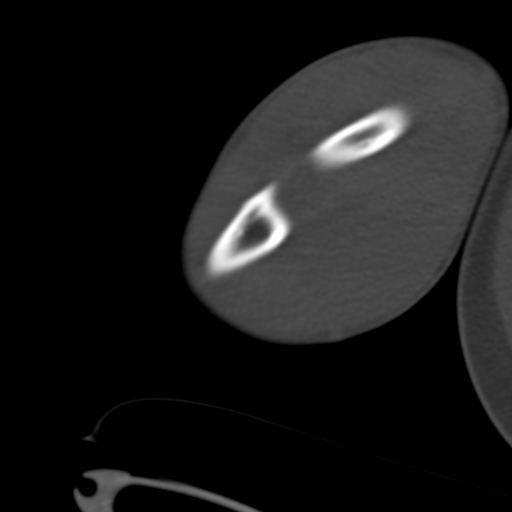
[im 12/52  bone]
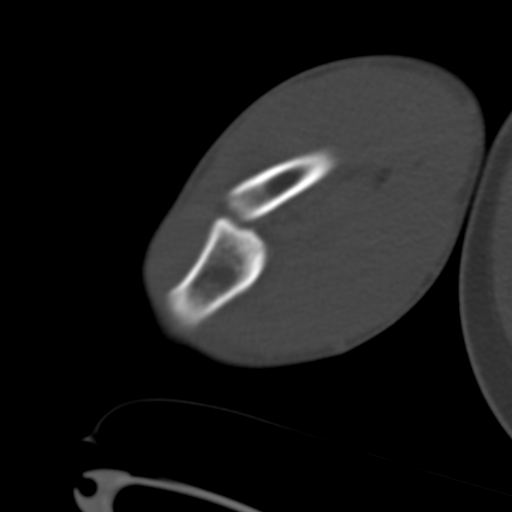
[im 16/52  bone]
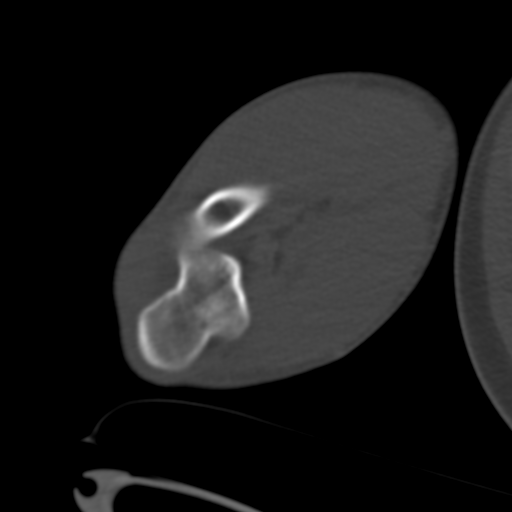
[im 20/52  soft-tissue]
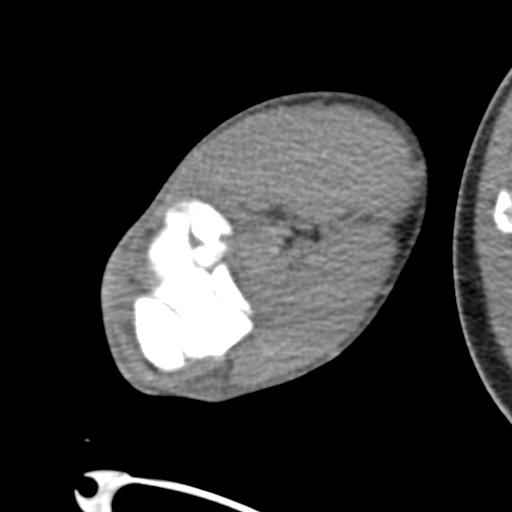
[im 20/52  bone]
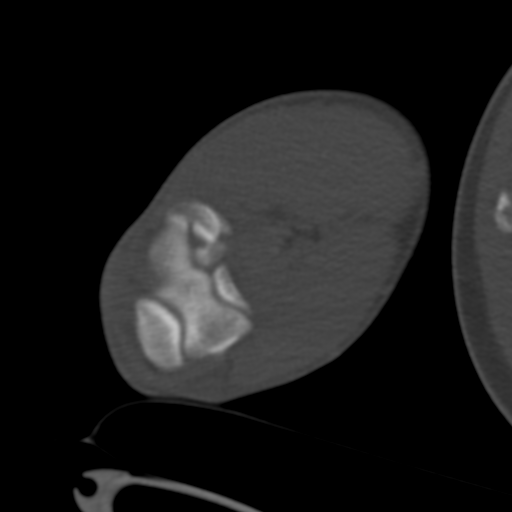
[im 28/52  bone]
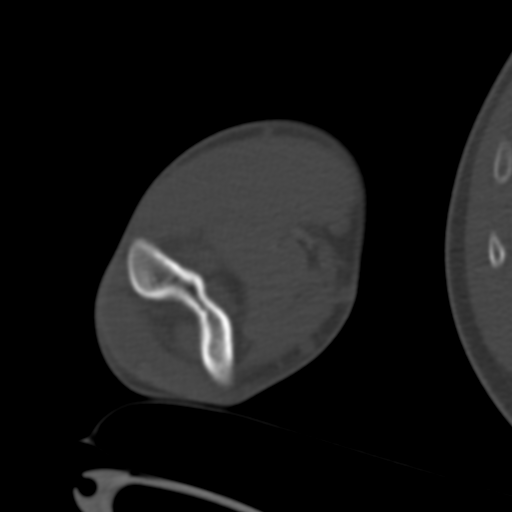
[im 32/52  bone]
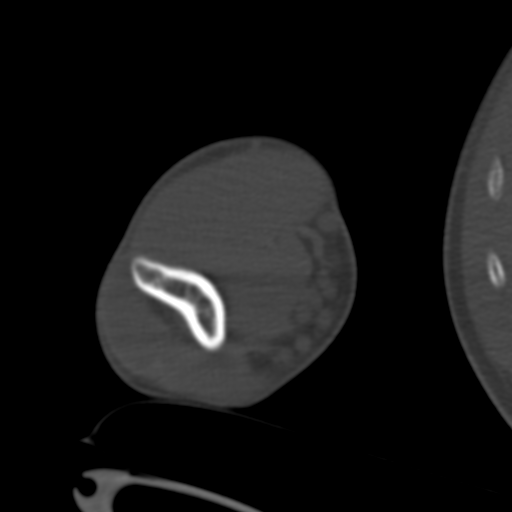
[im 36/52  bone]
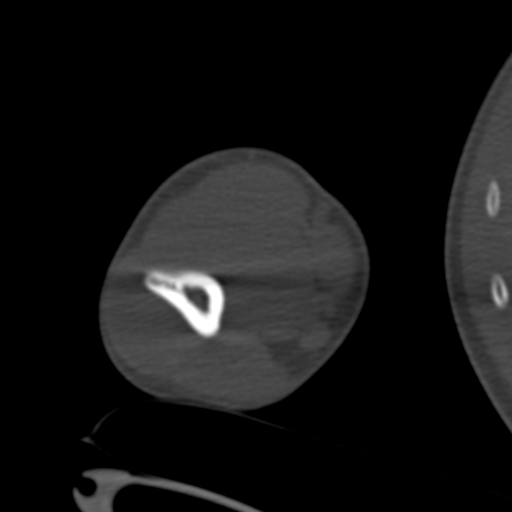
[im 40/52  soft-tissue]
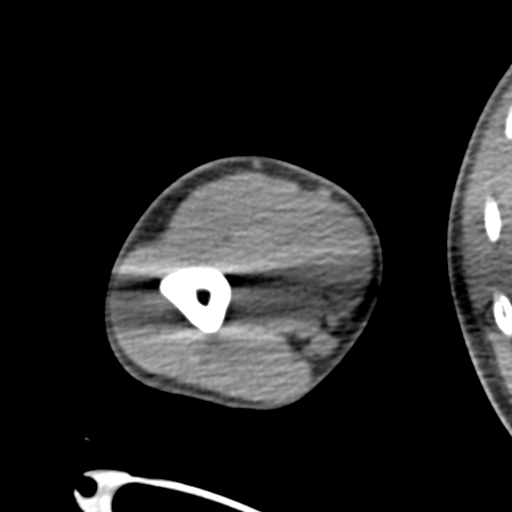
[im 40/52  bone]
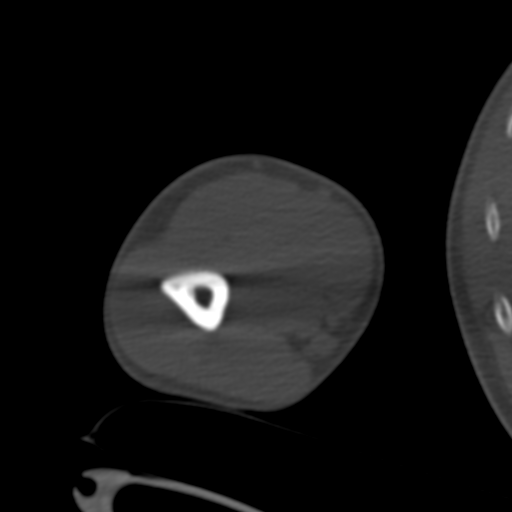
[im 44/52  bone]
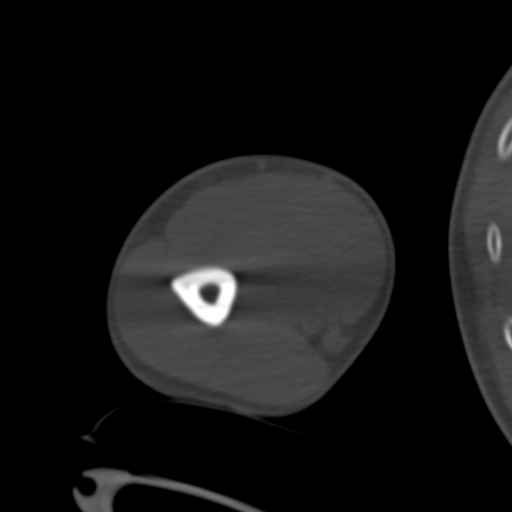
[im 48/52  bone]
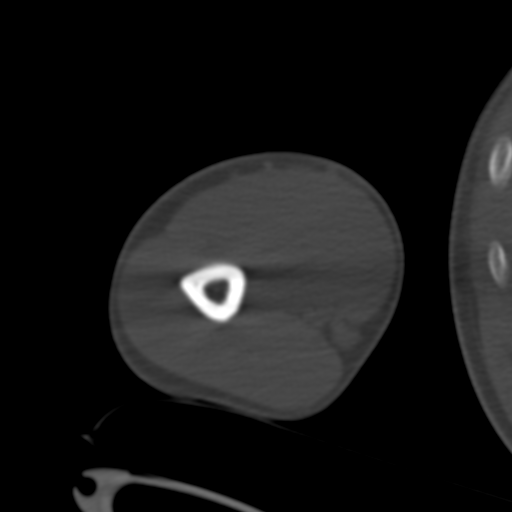

[Series 606: <mpr thick range(4)> · sagittal · 0.33mm/px · 5 of 33 slices shown, 6 images]
[im 11/33  bone]
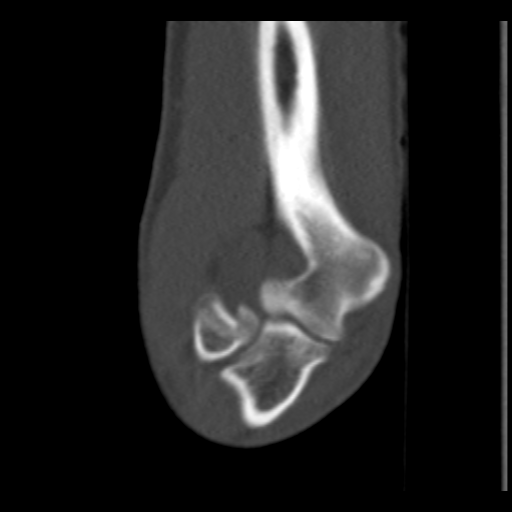
[im 14/33  bone]
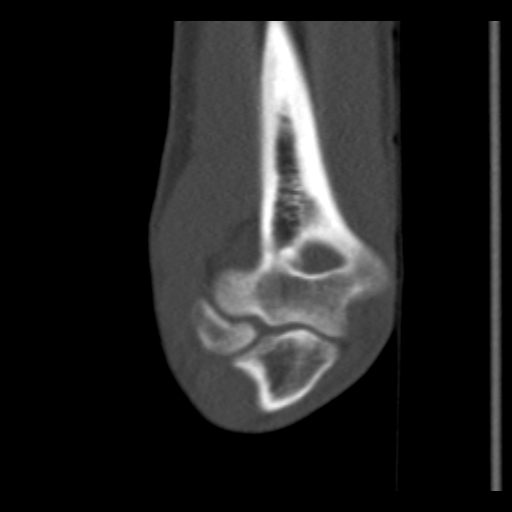
[im 17/33  soft-tissue]
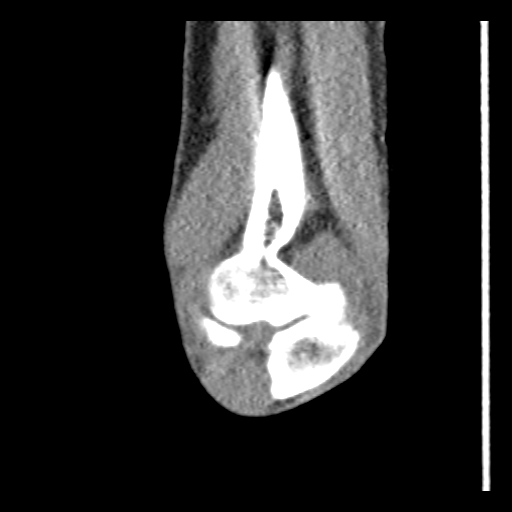
[im 17/33  bone]
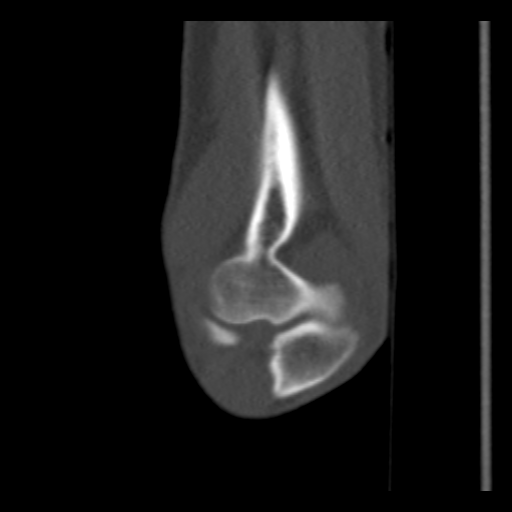
[im 19/33  bone]
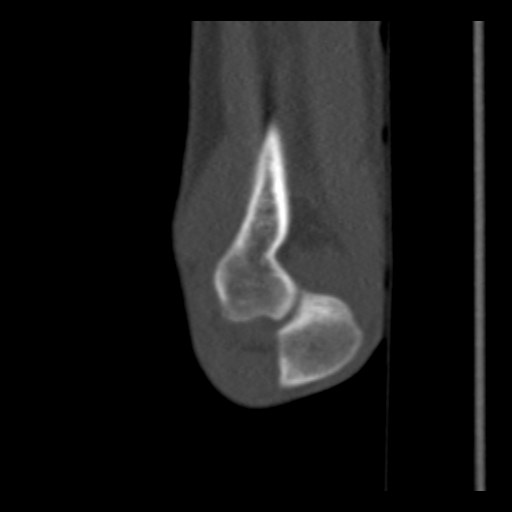
[im 22/33  bone]
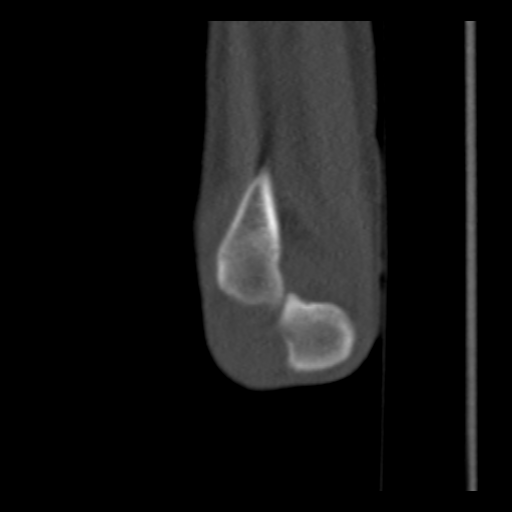

[16 of 27 positions shown; findings below may reference images not displayed]

FINDINGS: Mildly comminuted fracture of the radial head with 3.5 mm of
depression and impaction of the anterior fragment. There is an
associated effusion. No additional fractures and no dislocation.
IMPRESSION: Mildly comminuted and depressed radial head fracture with an
associated effusion.

## 2016-03-12 ENCOUNTER — Ambulatory Visit

## 2016-03-13 ENCOUNTER — Ambulatory Visit

## 2016-03-16 ENCOUNTER — Ambulatory Visit

## 2016-03-16 NOTE — Progress Notes (Signed)
ICAEL Requirement Documentation      Date: Mar 16, 2016    Patients Name: Cameron Quinn   DOB: 09/16/1991      Referring Provider/PCP: [email or web address]        Test ordered:  Echo      Reason for test: cp

## 2016-03-20 ENCOUNTER — Ambulatory Visit

## 2016-03-26 ENCOUNTER — Ambulatory Visit: Admitting: Internal Medicine

## 2016-03-26 NOTE — Progress Notes (Signed)
Primary Provider:  Thedore Mins       History of Present Illness:  Mr. Cameron Quinn is here fo revaluation of chest pain.     He is 25 years old and has no prior cardiac history. He has noticed chest pain and twinges for the past  5 years, which are nonexertional, lasting for a few seconds to minutes, and occuring at times while at rest. They are not associated with palpitations, syncope, or shortness of breath. He thinks they started at some point in college after taking up weight lifting, free weight chest press in particular, although he still has twinges now and he has taken a break from lifting.     He walking 4 miles per day in the course of goign to work, and does up to 40 minutes of cardio without symptoms.     An echocardiogram was obtained and was normal.     His father does have early CAD and had bypass grafting at age 64. His mother does not have early CAD.               Current Problems- Reviewed during today's visit  CHEST PAIN  Current Allergies- Reviewed during today's visit  NO KNOWN ALLERGIES  Past Medical History  Mild anxiety, not on medications  Surgical History  Right radial head fracture with ORIF.   Family History  Father with early CAD, s/p CABG at age 57.   Social History  Works out regularly, walks 4 miles per day enroute to work.   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      Risk Factors  Smoking Status:never smoked  Alcohol use: yes            Review of Systems   General: Denies fever, chills, sweats, anorexia, fatigue, weakness, malaise, weight loss.   Eyes: Denies visual change or blurring, eye pain.   Ears/Nose/Throat: Denies earache, decreased hearing, difficulty swallowing.   Cardiovascular: Denies chest pain or pressure, palpitations, shortness of breath.   Respiratory: Denies dry cough, productive cough, shortness of breath, wheezing.   Gastrointestinal: Denies acid indigestion, nausea, vomiting, diarrhea, abdominal pain, change in bowel habits, constipation, mucous  or blood in stools.   Musculoskeletal: Denies muscle cramps or aches, muscle weakness, morning stiffness, joint pain, joint swelling.   Skin: Denies dry skin, rash, skin ulcers, suspicious lesions, hx of skin cancer.   Psychiatric: Denies anxiety, depression, insomnia.     Vital Signs     Weight: 150 lb. Height: 69  in.    BMI: 22.23  BSA: 1.83    Wt chg: 6  Weight: 144 lbs   BMI: 21.34 on 03/20/2016    BP: 122/70      Medications and Allergies Reviewed          Physical Exam    General:      well developed, well nourished, in no acute distress.    Neck:      no masses, thyromegaly, or abnormal cervical nodes.    Lungs:      clear bilaterally to auscultation.    Heart:      non-displaced PMI, chest non-tender; regular rate and rhythm, S1, S2 without murmurs, rubs, or gallops  Abdomen:       normal bowel sounds; no hepatosplenomegaly no ventral,umbilical hernias or masses noted.    Msk:      no deformity or scoliosis noted of thoracic or lumbar spine.    Pulses:  pulses normal in all 4 extremities.    Extremities:      no clubbing, cyanosis, edema, or deformity noted with normal full range of motion of all joints.    Neurologic:      no focal deficits, cranial nerves II-XII grossly intact with normal sensation, reflexes, coordination, muscle strength and tone.    Psych:      alert and cooperative; normal mood and affect; normal attention span and concentration.    Additional Exam:      ECG: NSR         Assessment and Plan:      ~ CHEST PAIN (R07.9):    Mr. Aumiller is a pleasant and healthy 25 year old man with atypical /non anginal chest discomfort. His echo and ECG are normal, as are his labs. He is leading an an active and by his account healthy lifestyle, and as such his ten year risk of CAD is very low. He does have a significant family history and I have told him that in light of this he does need to pursue primary prevention for CAD seriously. He does not require any additional follow up from cardiology at  this time.

## 2016-03-27 ENCOUNTER — Ambulatory Visit
